# Patient Record
Sex: Female | Born: 1970 | Race: Black or African American | Hispanic: No | Smoking: Never smoker
Health system: Southern US, Community
[De-identification: ages and names within clinical notes are randomized; demographics above are authoritative.]

## PROBLEM LIST (undated history)

## (undated) DIAGNOSIS — R87810 Cervical high risk human papillomavirus (HPV) DNA test positive: Principal | ICD-10-CM

## (undated) DIAGNOSIS — R87610 Atypical squamous cells of undetermined significance on cytologic smear of cervix (ASC-US): Secondary | ICD-10-CM

## (undated) HISTORY — DX: Cervical high risk human papillomavirus (HPV) DNA test positive: R87.810

## (undated) HISTORY — PX: NO PAST SURGERIES: SHX2092

## (undated) HISTORY — PX: COLPOSCOPY: SHX161

## (undated) HISTORY — DX: Atypical squamous cells of undetermined significance on cytologic smear of cervix (ASC-US): R87.610

## (undated) HISTORY — PX: TUBAL LIGATION: SHX77

---

## 1995-10-29 DIAGNOSIS — O4290 Premature rupture of membranes, unspecified as to length of time between rupture and onset of labor, unspecified weeks of gestation: Secondary | ICD-10-CM

## 2004-03-10 ENCOUNTER — Emergency Department: Payer: Self-pay | Admitting: Emergency Medicine

## 2015-10-14 ENCOUNTER — Ambulatory Visit: Payer: Self-pay | Admitting: Family Medicine

## 2015-10-30 ENCOUNTER — Encounter: Payer: Self-pay | Admitting: Family Medicine

## 2015-10-30 ENCOUNTER — Ambulatory Visit (INDEPENDENT_AMBULATORY_CARE_PROVIDER_SITE_OTHER): Payer: BLUE CROSS/BLUE SHIELD | Admitting: Family Medicine

## 2015-10-30 VITALS — BP 110/70 | HR 90 | Temp 98.4°F | Resp 16 | Ht 64.0 in | Wt 133.6 lb

## 2015-10-30 DIAGNOSIS — R5383 Other fatigue: Secondary | ICD-10-CM | POA: Diagnosis not present

## 2015-10-30 DIAGNOSIS — N926 Irregular menstruation, unspecified: Secondary | ICD-10-CM | POA: Insufficient documentation

## 2015-10-30 DIAGNOSIS — R002 Palpitations: Secondary | ICD-10-CM | POA: Diagnosis not present

## 2015-10-30 NOTE — Progress Notes (Signed)
BP 110/70 mmHg  Pulse 90  Temp(Src) 98.4 F (36.9 C) (Oral)  Resp 16  Ht 5\' 4"  (1.626 m)  Wt 133 lb 9.6 oz (60.601 kg)  BMI 22.92 kg/m2  SpO2 98%  LMP 10/18/2015   Subjective:    Patient ID: Dana Wolf, female    DOB: 26-Sep-1970, 45 y.o.   MRN: [id number]  HPI: Dana Wolf is a 45 y.o. female  Chief Complaint  Patient presents with  . Menstrual Problem   Patient is here for evaluation of irregular periods Normal periods up through [place]; after 10 days, spotted for 2 more, the off, then bled heavy for 5 days; clots of blood; no breast tenderness, no nausea; she does not suspect pregnancy or miscarriage; her tubes are tied and husband has had a vascectomy Menarche at age 53; hx of heavy periods at times with clots; 3 SVD, largest child 7 pounds No personal hx of thyroid trouble; no fam hx of thyroid trouble No change in weight Stools have been a little loose Feeling a little shaky and anxious; she can wake up and feel her heart racing Father died last week, he had been sick Fatigued; not really resting; wakes up and can still sleep more when she gets up; not snoring Hot sweats  Depression screen PHQ 2/9 10/30/2015  Decreased Interest 0  Down, Depressed, Hopeless 0  PHQ - 2 Score 0   Relevant past medical, surgical, family and social history reviewed  History reviewed. No pertinent past medical history.  MD notes: Hx of anemia with first child; took iron pills; she has had three children; normal vaginal deliveries  Past Surgical History  Procedure Laterality Date  . No past surgeries    MD notes: 3 spontaneous vaginal deliveries  History reviewed. No pertinent family history.  MD notes: Adopted; father just died last week (not biological); he had been sick  Social History  Substance Use Topics  . Smoking status: Never Smoker   . Smokeless tobacco: None  . Alcohol Use: No   Interim medical history since last visit reviewed. Allergies and medications  reviewed  Review of Systems Per HPI unless specifically indicated above     Objective:    BP 110/70 mmHg  Pulse 90  Temp(Src) 98.4 F (36.9 C) (Oral)  Resp 16  Ht 5\' 4"  (1.626 m)  Wt 133 lb 9.6 oz (60.601 kg)  BMI 22.92 kg/m2  SpO2 98%  LMP 10/18/2015  Wt Readings from Last 3 Encounters:  10/30/15 133 lb 9.6 oz (60.601 kg)  MD note: weight was 133 pounds in march 2015  Physical Exam  Constitutional: She appears well-developed and well-nourished.  HENT:  Mouth/Throat: Mucous membranes are normal.  Eyes: EOM are normal. No scleral icterus.  No proptosis or exophthamos  Neck: No thyroid mass and no thyromegaly present.  Cardiovascular: Normal rate and regular rhythm.   Pulmonary/Chest: Effort normal and breath sounds normal.  Skin: She is not diaphoretic. No pallor.  Psychiatric: She has a normal mood and affect. Her behavior is normal.      Assessment & Plan:   Problem List Items Addressed This Visit      Other   Fatigue    Could by thyroid disorder; will start with labs; father's recent illness and death could play a part too; she does not want any medicine      Relevant Orders   Comprehensive metabolic panel   TSH   T4, free   CBC with Differential/Platelet  VITAMIN D 25 Hydroxy (Vit-D Deficiency, Fractures)   Irregular periods - Primary    ddx includes thyroid disorder; doubt perimenopause; could be endometriosis or fibroids; will start with labs, consider pelvic US and/or referral to gyn; stress with father's recent illness and death could certainly have affected her periods; no medicines prescribed today      Relevant Orders   Follicle stimulating hormone   Luteinizing hormone   TSH   T4, free    Other Visit Diagnoses    Palpitations        check K+, Mg2+    Relevant Orders    Magnesium       Follow up plan: No Follow-up on file.  An after-visit summary was printed and given to the patient at [facility].  Please see the patient instructions  which may contain other information and recommendations beyond what is mentioned above in the assessment and plan.  Orders Placed This Encounter  Procedures  . Comprehensive metabolic panel  . Follicle stimulating hormone  . Luteinizing hormone  . TSH  . T4, free  . CBC with Differential/Platelet  . VITAMIN D 25 Hydroxy (Vit-D Deficiency, Fractures)  . Magnesium

## 2015-10-30 NOTE — Patient Instructions (Signed)
Let's get labs today We'll call you tomorrow with the results Take a multiple vitamin daily Drink 8 glasses of water a day Call me with any changes

## 2015-11-02 NOTE — Assessment & Plan Note (Signed)
ddx includes thyroid disorder; doubt perimenopause; could be endometriosis or fibroids; will start with labs, consider pelvic US and/or referral to gyn; stress with father's recent illness and death could certainly have affected her periods; no medicines prescribed today

## 2015-11-02 NOTE — Assessment & Plan Note (Signed)
Could by thyroid disorder; will start with labs; father's recent illness and death could play a part too; she does not want any medicine

## 2015-11-19 ENCOUNTER — Encounter: Payer: BLUE CROSS/BLUE SHIELD | Admitting: Family Medicine

## 2015-11-29 ENCOUNTER — Encounter: Payer: Self-pay | Admitting: Family Medicine

## 2016-07-08 ENCOUNTER — Encounter: Payer: BLUE CROSS/BLUE SHIELD | Admitting: Family Medicine

## 2016-08-02 ENCOUNTER — Encounter: Payer: Self-pay | Admitting: Family Medicine

## 2016-08-02 ENCOUNTER — Other Ambulatory Visit: Payer: Self-pay | Admitting: Family Medicine

## 2016-08-02 ENCOUNTER — Ambulatory Visit (INDEPENDENT_AMBULATORY_CARE_PROVIDER_SITE_OTHER): Payer: BLUE CROSS/BLUE SHIELD | Admitting: Family Medicine

## 2016-08-02 VITALS — BP 110/72 | HR 93 | Temp 98.0°F | Resp 16 | Ht 63.9 in | Wt 138.7 lb

## 2016-08-02 DIAGNOSIS — R87610 Atypical squamous cells of undetermined significance on cytologic smear of cervix (ASC-US): Secondary | ICD-10-CM | POA: Diagnosis not present

## 2016-08-02 DIAGNOSIS — Z1231 Encounter for screening mammogram for malignant neoplasm of breast: Secondary | ICD-10-CM

## 2016-08-02 DIAGNOSIS — Z0000 Encounter for general adult medical examination without abnormal findings: Secondary | ICD-10-CM

## 2016-08-02 DIAGNOSIS — N898 Other specified noninflammatory disorders of vagina: Secondary | ICD-10-CM | POA: Diagnosis not present

## 2016-08-02 DIAGNOSIS — Z1239 Encounter for other screening for malignant neoplasm of breast: Secondary | ICD-10-CM

## 2016-08-02 DIAGNOSIS — Z114 Encounter for screening for human immunodeficiency virus [HIV]: Secondary | ICD-10-CM

## 2016-08-02 DIAGNOSIS — Z124 Encounter for screening for malignant neoplasm of cervix: Secondary | ICD-10-CM | POA: Diagnosis not present

## 2016-08-02 LAB — COMPLETE METABOLIC PANEL WITH GFR
ALBUMIN: 4.2 g/dL (ref 3.6–5.1)
ALT: 12 U/L (ref 6–29)
AST: 14 U/L (ref 10–30)
Alkaline Phosphatase: 53 U/L (ref 33–115)
BUN: 8 mg/dL (ref 7–25)
CALCIUM: 9.3 mg/dL (ref 8.6–10.2)
CHLORIDE: 105 mmol/L (ref 98–110)
CO2: 30 mmol/L (ref 20–31)
CREATININE: 0.63 mg/dL (ref 0.50–1.10)
GFR, Est Non African American: >89 mL/min (ref >=60)
Glucose, Bld: 91 mg/dL (ref 65–99)
Potassium: 4.3 mmol/L (ref 3.5–5.3)
Sodium: 139 mmol/L (ref 135–146)
TOTAL PROTEIN: 6.6 g/dL (ref 6.1–8.1)
Total Bilirubin: 0.8 mg/dL (ref 0.2–1.2)

## 2016-08-02 LAB — CBC WITH DIFFERENTIAL/PLATELET
Basophils Absolute: 0 cells/uL (ref 0–200)
Basophils Relative: 0 %
EOS ABS: 69 {cells}/uL (ref 15–500)
Eosinophils Relative: 1 %
HEMATOCRIT: 40.8 % (ref 35.0–45.0)
HEMOGLOBIN: 13.6 g/dL (ref 11.7–15.5)
LYMPHS ABS: 1656 {cells}/uL (ref 850–3900)
Lymphocytes Relative: 24 %
MCH: 31.5 pg (ref 27.0–33.0)
MCHC: 33.3 g/dL (ref 32.0–36.0)
MCV: 94.4 fL (ref 80.0–100.0)
MONO ABS: 483 {cells}/uL (ref 200–950)
MPV: 10.5 fL (ref 7.5–12.5)
Monocytes Relative: 7 %
NEUTROS PCT: 68 %
Neutro Abs: 4692 cells/uL (ref 1500–7800)
Platelets: 295 10*3/uL (ref 140–400)
RBC: 4.32 MIL/uL (ref 3.80–5.10)
RDW: 11.9 % (ref 11.0–15.0)
WBC: 6.9 10*3/uL (ref 3.8–10.8)

## 2016-08-02 LAB — LIPID PANEL
CHOLESTEROL: 146 mg/dL (ref <200)
HDL: 48 mg/dL — AB (ref >50)
LDL CALC: 82 mg/dL (ref <100)
TRIGLYCERIDES: 79 mg/dL (ref <150)
Total CHOL/HDL Ratio: 3 Ratio (ref <5.0)
VLDL: 16 mg/dL (ref <30)

## 2016-08-02 LAB — HIV ANTIBODY (ROUTINE TESTING W REFLEX): HIV 1&2 Ab, 4th Generation: NONREACTIVE

## 2016-08-02 NOTE — Assessment & Plan Note (Signed)
Pap smear done today

## 2016-08-02 NOTE — Progress Notes (Signed)
Patient ID: Dana Wolf, female   DOB: 23-Jan-1971, 46 y.o.   MRN: [id number]   Subjective:   Dana Wolf is a 46 y.o. female here for a complete physical exam  Interim issues since last visit: none  USPSTF grade A and B recommendations Alcohol: occasional Depression:  Depression screen [facility] 2/9 08/02/2016 10/30/2015  Decreased Interest 0 0  Down, Depressed, Hopeless 0 0  PHQ - 2 Score 0 0   Hypertension: controlled Obesity: no Tobacco use: passive smoke exposure HIV, hep B, hep C: Hiv already tested STD testing and prevention (chl/gon/syphilis): accepted; some discharge Lipids:  Glucose:  Colorectal cancer:  Breast cancer: no lumps; never had a mammogram BRCA gene screening: adopted, so fam hx unknown Intimate partner violence: no abuse Cervical cancer screening: today Lung cancer: n/a Osteoporosis: n/a Fall prevention/vitamin D: vit D 1000 iu recommended AAA: n/a Aspirin: n/a Diet: not enough fruits and veggies Exercise: need to improve Skin cancer: no worrisome moles Vaccines: flu UTD; not sure about tetanus, cannot get into old system  No past medical history on file.   Past Surgical History:  Procedure Laterality Date  . NO PAST SURGERIES     Family History  Problem Relation Age of Onset  . Adopted: Yes   Social History  Substance Use Topics  . Smoking status: Never Smoker  . Smokeless tobacco: Never Used  . Alcohol use No   Review of Systems  Objective:   Vitals:   08/02/16 0823  BP: 110/72  Pulse: 93  Resp: 16  Temp: 98 F (36.7 C)  TempSrc: Oral  SpO2: 98%  Weight: 138 lb 11.2 oz (62.9 kg)  Height: 5' 3.9" (1.623 m)   Body mass index is 23.88 kg/m. Wt Readings from Last 3 Encounters:  08/02/16 138 lb 11.2 oz (62.9 kg)  10/30/15 133 lb 9.6 oz (60.6 kg)   Physical Exam  Constitutional: She appears well-developed and well-nourished.  HENT:  Head: Normocephalic and atraumatic.  Eyes: Conjunctivae and EOM are normal. Right eye  exhibits no hordeolum. Left eye exhibits no hordeolum. No scleral icterus.  Neck: Carotid bruit is not present. No thyromegaly present.  Cardiovascular: Normal rate, regular rhythm, S1 normal, S2 normal and normal heart sounds.   No extrasystoles are present.  Pulmonary/Chest: Effort normal and breath sounds normal. No respiratory distress. Right breast exhibits no inverted nipple, no mass, no nipple discharge, no skin change and no tenderness. Left breast exhibits no inverted nipple, no mass, no nipple discharge, no skin change and no tenderness. Breasts are symmetrical.  Abdominal: Soft. Normal appearance and bowel sounds are normal. She exhibits no distension, no abdominal bruit, no pulsatile midline mass and no mass. There is no hepatosplenomegaly. There is no tenderness. No hernia.  Genitourinary: Uterus normal. Mass: scant area of friability 1 o'clock cervix. Pelvic exam was performed with patient prone. There is no rash or lesion on the right labia. There is no rash or lesion on the left labia. Cervix exhibits no motion tenderness and no discharge. Right adnexum displays no mass, no tenderness and no fullness. Left adnexum displays no mass, no tenderness and no fullness. No erythema or bleeding in the vagina. Injury: some clumpy whitish material, as well as thin watery discharge with fishy odor.    Musculoskeletal: Normal range of motion. She exhibits no edema.  Lymphadenopathy:       Head (right side): No submandibular adenopathy present.       Head (left side): No submandibular adenopathy present.  She has no cervical adenopathy.    She has no axillary adenopathy.  Neurological: She is alert. She displays no tremor. No cranial nerve deficit. She exhibits normal muscle tone. Gait normal.  Skin: Skin is warm and dry. No bruising and no ecchymosis noted. No cyanosis. No pallor.  Psychiatric: Her speech is normal and behavior is normal. Thought content normal. Her mood appears not anxious. She  does not exhibit a depressed mood.    Assessment/Plan:   Problem List Items Addressed This Visit      Other   Preventative health care - Primary    USPSTF grade A and B recommendations reviewed with patient; age-appropriate recommendations, preventive care, screening tests, etc discussed and encouraged; healthy living encouraged; see AVS for patient education given to patient      Relevant Orders   COMPLETE METABOLIC PANEL WITH GFR   Lipid panel   CBC with Differential/Platelet   Cervical cancer screening    Pap smear done today      Relevant Orders   Pap IG and HPV (high risk) DNA detection    Other Visit Diagnoses    Screening for HIV (human immunodeficiency virus)       Relevant Orders   HIV antibody (with reflex)   Vaginal discharge       Relevant Orders   WET PREP BY MOLECULAR PROBE   Breast cancer screening       Relevant Orders   MM DIGITAL SCREENING BILATERAL       No orders of the defined types were placed in this encounter.  Orders Placed This Encounter  Procedures  . WET PREP BY MOLECULAR PROBE  . MM DIGITAL SCREENING BILATERAL    Order Specific Question:   Reason for Exam (SYMPTOM  OR DIAGNOSIS REQUIRED)    Answer:   screening    Order Specific Question:   Is the patient pregnant?    Answer:   No    Order Specific Question:   Preferred imaging location?    Answer:   [facility]  . HIV antibody (with reflex)  . COMPLETE METABOLIC PANEL WITH GFR  . Lipid panel  . CBC with Differential/Platelet    Follow up plan: Return in about 1 year (around 08/02/2017) for complete physical.  An After Visit Summary was printed and given to the patient.

## 2016-08-02 NOTE — Assessment & Plan Note (Signed)
USPSTF grade A and B recommendations reviewed with patient; age-appropriate recommendations, preventive care, screening tests, etc discussed and encouraged; healthy living encouraged; see AVS for patient education given to patient  

## 2016-08-02 NOTE — Patient Instructions (Addendum)
I'll suggest 1,000 iu of vitamin D3 once a day Please do call to schedule your mammogram; the number to schedule one at either [facility] or [facility] is [phone]  Health Maintenance, Female Introduction Adopting a healthy lifestyle and getting preventive care can go a long way to promote health and wellness. Talk with your health care provider about what schedule of regular examinations is right for you. This is a good chance for you to check in with your provider about disease prevention and staying healthy. In between checkups, there are plenty of things you can do on your own. Experts have done a lot of research about which lifestyle changes and preventive measures are most likely to keep you healthy. Ask your health care provider for more information. Weight and diet Eat a healthy diet  Be sure to include plenty of vegetables, fruits, low-fat dairy products, and lean protein.  Do not eat a lot of foods high in solid fats, added sugars, or salt.  Get regular exercise. This is one of the most important things you can do for your health.  Most adults should exercise for at least 150 minutes each week. The exercise should increase your heart rate and make you sweat (moderate-intensity exercise).  Most adults should also do strengthening exercises at least twice a week. This is in addition to the moderate-intensity exercise. Maintain a healthy weight  Body mass index (BMI) is a measurement that can be used to identify possible weight problems. It estimates body fat based on height and weight. Your health care provider can help determine your BMI and help you achieve or maintain a healthy weight.  For females 3 years of age and older:  A BMI below 18.5 is considered underweight.  A BMI of 18.5 to 24.9 is normal.  A BMI of 25 to 29.9 is considered overweight.  A BMI of 30 and above is considered obese. Watch levels of cholesterol and blood  lipids  You should start having your blood tested for lipids and cholesterol at 44 years of age, then have this test every 5 years.  You may need to have your cholesterol levels checked more often if:  Your lipid or cholesterol levels are high.  You are older than 44 years of age.  You are at high risk for heart disease. Cancer screening Lung Cancer  Lung cancer screening is recommended for adults 74-56 years old who are at high risk for lung cancer because of a history of smoking.  A yearly low-dose CT scan of the lungs is recommended for people who:  Currently smoke.  Have quit within the past 15 years.  Have at least a 30-pack-year history of smoking. A pack year is smoking an average of one pack of cigarettes a day for 1 year.  Yearly screening should continue until it has been 15 years since you quit.  Yearly screening should stop if you develop a health problem that would prevent you from having lung cancer treatment. Breast Cancer  Practice breast self-awareness. This means understanding how your breasts normally appear and feel.  It also means doing regular breast self-exams. Let your health care provider know about any changes, no matter how small.  If you are in your 20s or 30s, you should have a clinical breast exam (CBE) by a health care provider every 1-3 years as part of a regular health exam.  If you are 75 or older, have a CBE every year. Also consider having  a breast X-ray (mammogram) every year.  If you have a family history of breast cancer, talk to your health care provider about genetic screening.  If you are at high risk for breast cancer, talk to your health care provider about having an MRI and a mammogram every year.  Breast cancer gene (BRCA) assessment is recommended for women who have family members with BRCA-related cancers. BRCA-related cancers include:  Breast.  Ovarian.  Tubal.  Peritoneal cancers.  Results of the assessment will  determine the need for genetic counseling and BRCA1 and BRCA2 testing. Cervical Cancer  Your health care provider may recommend that you be screened regularly for cancer of the pelvic organs (ovaries, uterus, and vagina). This screening involves a pelvic examination, including checking for microscopic changes to the surface of your cervix (Pap test). You may be encouraged to have this screening done every 3 years, beginning at age 82.  For women ages 58-65, health care providers may recommend pelvic exams and Pap testing every 3 years, or they may recommend the Pap and pelvic exam, combined with testing for human papilloma virus (HPV), every 5 years. Some types of HPV increase your risk of cervical cancer. Testing for HPV may also be done on women of any age with unclear Pap test results.  Other health care providers may not recommend any screening for nonpregnant women who are considered low risk for pelvic cancer and who do not have symptoms. Ask your health care provider if a screening pelvic exam is right for you.  If you have had past treatment for cervical cancer or a condition that could lead to cancer, you need Pap tests and screening for cancer for at least 20 years after your treatment. If Pap tests have been discontinued, your risk factors (such as having a new sexual partner) need to be reassessed to determine if screening should resume. Some women have medical problems that increase the chance of getting cervical cancer. In these cases, your health care provider may recommend more frequent screening and Pap tests. Colorectal Cancer  This type of cancer can be detected and often prevented.  Routine colorectal cancer screening usually begins at 44 years of age and continues through 44 years of age.  Your health care provider may recommend screening at an earlier age if you have risk factors for colon cancer.  Your health care provider may also recommend using home test kits to check for  hidden blood in the stool.  A small camera at the end of a tube can be used to examine your colon directly (sigmoidoscopy or colonoscopy). This is done to check for the earliest forms of colorectal cancer.  Routine screening usually begins at age 9.  Direct examination of the colon should be repeated every 5-10 years through 44 years of age. However, you may need to be screened more often if early forms of precancerous polyps or small growths are found. Skin Cancer  Check your skin from head to toe regularly.  Tell your health care provider about any new moles or changes in moles, especially if there is a change in a mole's shape or color.  Also tell your health care provider if you have a mole that is larger than the size of a pencil eraser.  Always use sunscreen. Apply sunscreen liberally and repeatedly throughout the day.  Protect yourself by wearing long sleeves, pants, a wide-brimmed hat, and sunglasses whenever you are outside. Heart disease, diabetes, and high blood pressure  High blood pressure  causes heart disease and increases the risk of stroke. High blood pressure is more likely to develop in:  People who have blood pressure in the high end of the normal range (130-139/85-89 mm Hg).  People who are overweight or obese.  People who are African American.  If you are 72-25 years of age, have your blood pressure checked every 3-5 years. If you are 54 years of age or older, have your blood pressure checked every year. You should have your blood pressure measured twice-once when you are at a hospital or clinic, and once when you are not at a hospital or clinic. Record the average of the two measurements. To check your blood pressure when you are not at a hospital or clinic, you can use:  An automated blood pressure machine at a pharmacy.  A home blood pressure monitor.  If you are between 50 years and 41 years old, ask your health care provider if you should take aspirin to  prevent strokes.  Have regular diabetes screenings. This involves taking a blood sample to check your fasting blood sugar level.  If you are at a normal weight and have a low risk for diabetes, have this test once every three years after 44 years of age.  If you are overweight and have a high risk for diabetes, consider being tested at a younger age or more often. Preventing infection Hepatitis B  If you have a higher risk for hepatitis B, you should be screened for this virus. You are considered at high risk for hepatitis B if:  You were born in a country where hepatitis B is common. Ask your health care provider which countries are considered high risk.  Your parents were born in a high-risk country, and you have not been immunized against hepatitis B (hepatitis B vaccine).  You have HIV or AIDS.  You use needles to inject street drugs.  You live with someone who has hepatitis B.  You have had sex with someone who has hepatitis B.  You get hemodialysis treatment.  You take certain medicines for conditions, including cancer, organ transplantation, and autoimmune conditions. Hepatitis C  Blood testing is recommended for:  Everyone born from [birth year] through 1963.  Anyone with known risk factors for hepatitis C. Sexually transmitted infections (STIs)  You should be screened for sexually transmitted infections (STIs) including gonorrhea and chlamydia if:  You are sexually active and are younger than 44 years of age.  You are older than 44 years of age and your health care provider tells you that you are at risk for this type of infection.  Your sexual activity has changed since you were last screened and you are at an increased risk for chlamydia or gonorrhea. Ask your health care provider if you are at risk.  If you do not have HIV, but are at risk, it may be recommended that you take a prescription medicine daily to prevent HIV infection. This is called pre-exposure  prophylaxis (PrEP). You are considered at risk if:  You are sexually active and do not regularly use condoms or know the HIV status of your partner(s).  You take drugs by injection.  You are sexually active with a partner who has HIV. Talk with your health care provider about whether you are at high risk of being infected with HIV. If you choose to begin PrEP, you should first be tested for HIV. You should then be tested every 3 months for as long as you are  taking PrEP. Pregnancy  If you are premenopausal and you may become pregnant, ask your health care provider about preconception counseling.  If you may become pregnant, take 400 to 800 micrograms (mcg) of folic acid every day.  If you want to prevent pregnancy, talk to your health care provider about birth control (contraception). Osteoporosis and menopause  Osteoporosis is a disease in which the bones lose minerals and strength with aging. This can result in serious bone fractures. Your risk for osteoporosis can be identified using a bone density scan.  If you are 63 years of age or older, or if you are at risk for osteoporosis and fractures, ask your health care provider if you should be screened.  Ask your health care provider whether you should take a calcium or vitamin D supplement to lower your risk for osteoporosis.  Menopause may have certain physical symptoms and risks.  Hormone replacement therapy may reduce some of these symptoms and risks. Talk to your health care provider about whether hormone replacement therapy is right for you. Follow these instructions at home:  Schedule regular health, dental, and eye exams.  Stay current with your immunizations.  Do not use any tobacco products including cigarettes, chewing tobacco, or electronic cigarettes.  If you are pregnant, do not drink alcohol.  If you are breastfeeding, limit how much and how often you drink alcohol.  Limit alcohol intake to no more than 1 drink  per day for nonpregnant women. One drink equals 12 ounces of beer, 5 ounces of wine, or 1 ounces of hard liquor.  Do not use street drugs.  Do not share needles.  Ask your health care provider for help if you need support or information about quitting drugs.  Tell your health care provider if you often feel depressed.  Tell your health care provider if you have ever been abused or do not feel safe at home. This information is not intended to replace advice given to you by your health care provider. Make sure you discuss any questions you have with your health care provider. Document Released: 12/07/2010 Document Revised: 10/30/2015 Document Reviewed: 02/25/2015  2017 [employer]

## 2016-08-03 LAB — WET PREP BY MOLECULAR PROBE
CANDIDA SPECIES: NOT DETECTED
GARDNERELLA VAGINALIS: DETECTED — AB
TRICHOMONAS VAG: NOT DETECTED

## 2016-08-04 ENCOUNTER — Telehealth: Payer: Self-pay | Admitting: Family Medicine

## 2016-08-04 ENCOUNTER — Encounter: Payer: Self-pay | Admitting: Family Medicine

## 2016-08-04 DIAGNOSIS — R87610 Atypical squamous cells of undetermined significance on cytologic smear of cervix (ASC-US): Secondary | ICD-10-CM

## 2016-08-04 DIAGNOSIS — R87810 Cervical high risk human papillomavirus (HPV) DNA test positive: Principal | ICD-10-CM

## 2016-08-04 HISTORY — DX: Atypical squamous cells of undetermined significance on cytologic smear of cervix (ASC-US): R87.610

## 2016-08-04 LAB — PAP IG AND HPV HIGH-RISK: HPV DNA HIGH RISK: DETECTED — AB

## 2016-08-04 MED ORDER — METRONIDAZOLE 500 MG PO TABS: 500.0000 mg | ORAL_TABLET | Freq: Two times a day (BID) | ORAL | 0 refills | Status: AC

## 2016-08-04 NOTE — Assessment & Plan Note (Signed)
Refer to GYN; discussed with pt by phone; sexually transmitted; condoms

## 2016-08-04 NOTE — Telephone Encounter (Signed)
I discussed pap smear with patient; referral placed to gyn [facility] [place]; start flagyl Other labs discussed

## 2016-08-11 ENCOUNTER — Telehealth: Payer: Self-pay | Admitting: Family Medicine

## 2016-08-11 NOTE — Telephone Encounter (Signed)
errenous °

## 2016-08-24 ENCOUNTER — Encounter: Payer: BLUE CROSS/BLUE SHIELD | Admitting: Obstetrics and Gynecology

## 2016-09-02 ENCOUNTER — Encounter: Payer: Self-pay | Admitting: Obstetrics and Gynecology

## 2016-09-02 ENCOUNTER — Ambulatory Visit (INDEPENDENT_AMBULATORY_CARE_PROVIDER_SITE_OTHER): Payer: BLUE CROSS/BLUE SHIELD | Admitting: Obstetrics and Gynecology

## 2016-09-02 VITALS — BP 108/74 | HR 79 | Ht 63.0 in | Wt 143.0 lb

## 2016-09-02 DIAGNOSIS — R87810 Cervical high risk human papillomavirus (HPV) DNA test positive: Secondary | ICD-10-CM

## 2016-09-02 DIAGNOSIS — N72 Inflammatory disease of cervix uteri: Secondary | ICD-10-CM | POA: Diagnosis not present

## 2016-09-02 DIAGNOSIS — R87610 Atypical squamous cells of undetermined significance on cytologic smear of cervix (ASC-US): Secondary | ICD-10-CM | POA: Diagnosis not present

## 2016-09-02 NOTE — Addendum Note (Signed)
Addended by: Raliegh Ip on: 09/02/2016 03:05 PM   Modules accepted: Orders

## 2016-09-02 NOTE — Progress Notes (Signed)
Referring Provider:  Sanda Klein  HPI:  Dana Wolf is a 46 y.o.  [phone]  who presents today for evaluation and management of abnormal cervical cytology.    Dysplasia History:  ASCUS witn POS High risk viral types  ROS:  Pertinent items are noted in HPI.  OB History  Gravida Para Term Preterm AB Living  3 3 2 1   3   SAB TAB Ectopic Multiple Live Births          3    # Outcome Date GA Lbr Len/2nd Weight Sex Delivery Anes PTL Lv  3 Term 1999    F Vag-Spont   LIV  2 Preterm 1997    M Vag-Spont   LIV  1 Term 96    F Vag-Spont   LIV      Past Medical History:  Diagnosis Date  . 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 NO PAST SURGERIES      SOCIAL HISTORY: History  Alcohol Use No   History  Drug Use No     Family History  Problem Relation Age of Onset  . Adopted: Yes    ALLERGIES:  Patient has no known allergies.  She currently has no medications in their medication list.  Physical Exam: -Vitals:  BP 108/74   Pulse 79   Ht 5\' 3"  (1.6 m)   Wt 143 lb (64.9 kg)   LMP 08/20/2016 (Exact Date)   BMI 25.33 kg/m  GEN: WD, WN, NAD.  A+ O x 3, good mood and affect. ABD:  NT, ND.  Soft, no masses.  No hernias noted.   Pelvic:   Vulva: Normal appearance.  No lesions.  Vagina: No lesions or abnormalities noted.  Support: Normal pelvic support.  Urethra No masses tenderness or scarring.  Meatus Normal size without lesions or prolapse.  Cervix: See below.  Anus: Normal exam.  No lesions.  Perineum: Normal exam.  No lesions.        Bimanual   Uterus: Normal size.  Non-tender.  Mobile.  AV.  Adnexae: No masses.  Non-tender to palpation.  Cul-de-sac: Negative for abnormality.   PROCEDURE: 1.  Urine Pregnancy Test:  not done 2.  Colposcopy performed with 4% acetic acid after verbal consent obtained                           -Aceto-white Lesions Location(s): 8-11 and 2-4  o'clock.              -Biopsy performed at 8 and 4 o'clock               -ECC indicated and performed: No.     -Biopsy sites made hemostatic with pressure and Monsel's solution   -Satisfactory colposcopy: Yes.      -Evidence of Invasive cervical CA :  NO  ASSESSMENT:  Dana Wolf is a 46 y.o. [id number] here for  1. ASCUS with positive high risk HPV cervical   .  PLAN: 1.  I discussed the grading system of pap smears and HPV high risk viral types.  We will discuss management after colpo results return.  No orders of the defined types were placed in this encounter.         F/U  Return in about 1 week (around 09/09/2016).  [name] ,MD 09/02/2016,2:20 PM

## 2016-09-06 LAB — PATHOLOGY

## 2016-09-09 ENCOUNTER — Encounter: Payer: Self-pay | Admitting: Obstetrics and Gynecology

## 2016-09-09 ENCOUNTER — Ambulatory Visit (INDEPENDENT_AMBULATORY_CARE_PROVIDER_SITE_OTHER): Payer: BLUE CROSS/BLUE SHIELD | Admitting: Obstetrics and Gynecology

## 2016-09-09 VITALS — BP 95/65 | HR 82 | Ht 63.0 in | Wt 142.4 lb

## 2016-09-09 DIAGNOSIS — R87610 Atypical squamous cells of undetermined significance on cytologic smear of cervix (ASC-US): Secondary | ICD-10-CM

## 2016-09-09 DIAGNOSIS — R87810 Cervical high risk human papillomavirus (HPV) DNA test positive: Secondary | ICD-10-CM | POA: Diagnosis not present

## 2016-09-09 NOTE — Progress Notes (Addendum)
HPI:      [name] is a 46 y.o. [phone] who LMP was Patient's last menstrual period was 08/20/2016 (exact date).  Subjective:   She presents today For follow-up after colposcopy. Her colposcopy revealed no dysplasia. This is consistent with ASCUS Pap smear. (She does have positive HPV.)    Hx: The following portions of the patient's history were reviewed and updated as appropriate:              She  has a past medical history of ASCUS with positive high risk HPV cervical (08/04/2016). She  does not have any pertinent problems on file. She  has a past surgical history that includes No past surgeries and Colposcopy. No current outpatient prescriptions on file prior to visit.   No current facility-administered medications on file prior to visit.          Review of Systems:  Review of Systems  Constitutional: Denied constitutional symptoms, night sweats, recent illness, fatigue, fever, insomnia and weight loss.  Eyes: Denied eye symptoms, eye pain, photophobia, vision change and visual disturbance.  Ears/Nose/Throat/Neck: Denied ear, nose, throat or neck symptoms, hearing loss, nasal discharge, sinus congestion and sore throat.  Cardiovascular: Denied cardiovascular symptoms, arrhythmia, chest pain/pressure, edema, exercise intolerance, orthopnea and palpitations.  Respiratory: Denied pulmonary symptoms, asthma, pleuritic pain, productive sputum, cough, dyspnea and wheezing.  Gastrointestinal: Denied, gastro-esophageal reflux, melena, nausea and vomiting.  Genitourinary: Denied genitourinary symptoms including symptomatic vaginal discharge, pelvic relaxation issues, and urinary complaints.  Musculoskeletal: Denied musculoskeletal symptoms, stiffness, swelling, muscle weakness and myalgia.  Dermatologic: Denied dermatology symptoms, rash and scar.  Neurologic: Denied neurology symptoms, dizziness, headache, neck pain and syncope.  Psychiatric: Denied psychiatric symptoms, anxiety  and depression.  Endocrine: Denied endocrine symptoms including hot flashes and night sweats.   Meds:   No current outpatient prescriptions on file prior to visit.   No current facility-administered medications on file prior to visit.     Objective:     Vitals:   09/09/16 1527  BP: 95/65  Pulse: 82              Cervical biopsy results reviewed and discussed directly with the patient.  Assessment:    [id number] Patient Active Problem List   Diagnosis Date Noted  . ASCUS with positive high risk HPV cervical 08/04/2016  . Preventative health care 08/02/2016  . Cervical cancer screening 08/02/2016  . Irregular periods 10/30/2015  . Fatigue 10/30/2015     1. ASCUS with positive high risk HPV cervical     No evidence of abnormality on colposcopically directed biopsies.   Plan:            1.  Recommend patient return to Dr. Sanda Klein for routine Pap cytology only in February. If worse than ASCUS re-colpo.  If ASCUS consider repeat cytology 6 months later.        F/U  No Follow-up on file. I spent 16 minutes with this patient of which greater than 50% was spent discussing abnormal cervical cytology, colposcopy results, future follow-up.  Finis Bud, M.D. 09/09/2016 3:46 PM

## 2016-09-14 ENCOUNTER — Ambulatory Visit
Admission: RE | Admit: 2016-09-14 | Discharge: 2016-09-14 | Disposition: A | Discharge status: Home / Self Care | Payer: BLUE CROSS/BLUE SHIELD | Source: Ambulatory Visit | Attending: Family Medicine | Admitting: Family Medicine

## 2016-09-14 DIAGNOSIS — Z1231 Encounter for screening mammogram for malignant neoplasm of breast: Secondary | ICD-10-CM | POA: Insufficient documentation

## 2016-09-15 ENCOUNTER — Other Ambulatory Visit: Payer: Self-pay

## 2016-09-15 DIAGNOSIS — N6320 Unspecified lump in the left breast, unspecified quadrant: Secondary | ICD-10-CM

## 2016-09-15 DIAGNOSIS — N6489 Other specified disorders of breast: Secondary | ICD-10-CM

## 2016-09-16 ENCOUNTER — Other Ambulatory Visit: Payer: Self-pay | Admitting: Family Medicine

## 2016-09-16 DIAGNOSIS — R928 Other abnormal and inconclusive findings on diagnostic imaging of breast: Secondary | ICD-10-CM

## 2016-09-16 DIAGNOSIS — N6489 Other specified disorders of breast: Secondary | ICD-10-CM

## 2016-09-16 DIAGNOSIS — N6320 Unspecified lump in the left breast, unspecified quadrant: Secondary | ICD-10-CM

## 2016-09-20 ENCOUNTER — Ambulatory Visit
Admission: RE | Admit: 2016-09-20 | Discharge: 2016-09-20 | Disposition: A | Discharge status: Home / Self Care | Payer: BLUE CROSS/BLUE SHIELD | Source: Ambulatory Visit | Attending: Family Medicine | Admitting: Family Medicine

## 2016-09-20 DIAGNOSIS — R928 Other abnormal and inconclusive findings on diagnostic imaging of breast: Secondary | ICD-10-CM | POA: Diagnosis not present

## 2016-09-20 DIAGNOSIS — N6320 Unspecified lump in the left breast, unspecified quadrant: Secondary | ICD-10-CM

## 2016-09-20 DIAGNOSIS — N6324 Unspecified lump in the left breast, lower inner quadrant: Secondary | ICD-10-CM | POA: Insufficient documentation

## 2016-09-20 DIAGNOSIS — N6489 Other specified disorders of breast: Secondary | ICD-10-CM

## 2017-03-10 ENCOUNTER — Telehealth: Payer: Self-pay | Admitting: Family Medicine

## 2017-03-10 DIAGNOSIS — N6459 Other signs and symptoms in breast: Secondary | ICD-10-CM | POA: Insufficient documentation

## 2017-03-10 NOTE — Telephone Encounter (Signed)
-----   Message from Arnetha Courser, MD sent at 09/20/2016  3:54 PM EDT ----- Regarding: 6 month f/u LEFT breast US RECOMMENDATION: Six-month follow-up left breast ultrasound is recommended. Due around March 22, 2017

## 2017-03-10 NOTE — Telephone Encounter (Signed)
Please let pt know that it's almost time to rescan her left breast Ask if any changes to her breasts (new lumps or problems) I've entered the left breast ultrasound already, but let me know if other issues to order other side if needed

## 2017-03-10 NOTE — Assessment & Plan Note (Signed)
Trayce 16, 2018: RECOMMENDATION: Six-month follow-up left breast ultrasound is recommended.

## 2017-03-14 NOTE — Telephone Encounter (Signed)
Pt.notified

## 2017-07-18 ENCOUNTER — Encounter: Payer: Self-pay | Admitting: Family Medicine

## 2017-07-18 ENCOUNTER — Ambulatory Visit (INDEPENDENT_AMBULATORY_CARE_PROVIDER_SITE_OTHER): Payer: BLUE CROSS/BLUE SHIELD | Admitting: Family Medicine

## 2017-07-18 ENCOUNTER — Ambulatory Visit
Admission: RE | Admit: 2017-07-18 | Discharge: 2017-07-18 | Disposition: A | Discharge status: Home / Self Care | Payer: BLUE CROSS/BLUE SHIELD | Source: Ambulatory Visit | Attending: Family Medicine | Admitting: Family Medicine

## 2017-07-18 VITALS — BP 112/72 | HR 79 | Temp 98.2°F | Resp 14 | Wt 147.8 lb

## 2017-07-18 DIAGNOSIS — M79672 Pain in left foot: Secondary | ICD-10-CM | POA: Diagnosis not present

## 2017-07-18 DIAGNOSIS — R87610 Atypical squamous cells of undetermined significance on cytologic smear of cervix (ASC-US): Secondary | ICD-10-CM

## 2017-07-18 DIAGNOSIS — G8929 Other chronic pain: Secondary | ICD-10-CM

## 2017-07-18 DIAGNOSIS — R87810 Cervical high risk human papillomavirus (HPV) DNA test positive: Secondary | ICD-10-CM | POA: Diagnosis not present

## 2017-07-18 DIAGNOSIS — N920 Excessive and frequent menstruation with regular cycle: Secondary | ICD-10-CM

## 2017-07-18 MED ORDER — NAPROXEN 375 MG PO TABS: 375.0000 mg | ORAL_TABLET | Freq: Two times a day (BID) | ORAL | 0 refills | Status: DC

## 2017-07-18 NOTE — Assessment & Plan Note (Signed)
Will start naproxen, try to block PGE-2 prior to release

## 2017-07-18 NOTE — Patient Instructions (Signed)
Please do start the new medicine which should help your periods and your foot pain Do not take any other NSAIDs (Aleve, Motrin, Advil, etc.) Tylenol per package directions is fine Have the xrays done across the street If you have not heard anything from my staff in a week about any orders/referrals/studies from today, please contact us here to follow-up [phone] Good supportive hard sole shoes

## 2017-07-18 NOTE — Progress Notes (Signed)
BP 112/72   Pulse 79   Temp 98.2 F (36.8 C) (Oral)   Resp 14   Wt 147 lb 12.8 oz (67 kg)   LMP 07/08/2017   SpO2 98%   BMI 26.18 kg/m    Subjective:    Patient ID: Dana Wolf, female    DOB: 1970/07/06, 47 y.o.   MRN: [id number]  HPI: Dana Wolf is a 47 y.o. female  Chief Complaint  Patient presents with  . Foot Pain    left comes and goes, sometimes cannot even walk on it.  . Menorrhagia    tubes are tied, she has very painful sick periods with bad crampin, nauesa and vomiting the first 2 days    HPI Patient is here for two issues One is left foot pain; comes and goes Going on for a month Some days she cannot even walk; no injuries, no falls, no twisted ankle Just started aching out of the blue and stayed there No excessive movement or hiking She has tried ibuprofen and that hasn't helped; soaked it; "I thought it would have went away on its own" she says Walks on concrete floors at work; wearing good supportive tennis shoes; no flip flops Not sure if arthritis Nothing similar before  She also has painful sick periods with bad cramps; nausea and vomiting the first two days; her periods are heavy for the first two days She is due for repeat pap only this month and is coming back for physical later this month  Depression screen [facility] 2/9 07/18/2017 08/02/2016 10/30/2015  Decreased Interest 0 0 0  Down, Depressed, Hopeless 0 0 0  PHQ - 2 Score 0 0 0    Relevant past medical, surgical, family and social history reviewed Past Medical History:  Diagnosis Date  . ASCUS with positive high risk HPV cervical 08/04/2016   Refer to GYN Feb 2018   Past Surgical History:  Procedure Laterality Date  . COLPOSCOPY    . NO PAST SURGERIES    . TUBAL LIGATION     Family History  Adopted: Yes   Social History   Tobacco Use  . Smoking status: Never Smoker  . Smokeless tobacco: Never Used  Substance Use Topics  . Alcohol use: No    Alcohol/week: 0.0 oz  . Drug use:  No    Interim medical history since last visit reviewed. Allergies and medications reviewed  Review of Systems Per HPI unless specifically indicated above     Objective:    BP 112/72   Pulse 79   Temp 98.2 F (36.8 C) (Oral)   Resp 14   Wt 147 lb 12.8 oz (67 kg)   LMP 07/08/2017   SpO2 98%   BMI 26.18 kg/m   Wt Readings from Last 3 Encounters:  07/18/17 147 lb 12.8 oz (67 kg)  09/09/16 142 lb 7 oz (64.6 kg)  09/02/16 143 lb (64.9 kg)    Physical Exam  Constitutional: She appears well-developed and well-nourished.  HENT:  Mouth/Throat: Mucous membranes are normal.  Eyes: EOM are normal. No scleral icterus.  Cardiovascular: Normal rate and regular rhythm.  Pulses:      Dorsalis pedis pulses are 2+ on the right side, and 2+ on the left side.  Pulmonary/Chest: Effort normal and breath sounds normal.  Musculoskeletal:       Right ankle: She exhibits normal range of motion and no swelling.       Left ankle: She exhibits normal range of  motion and no swelling.       Right foot: There is normal range of motion, no tenderness, no swelling and no deformity.       Left foot: There is tenderness. There is normal range of motion, no bony tenderness, no swelling and no deformity.  Skin:  No erythema, no rash over the feet; specifically nothing that is suggestive of shingles or gout on the left foot  Psychiatric: She has a normal mood and affect. Her behavior is normal. Her mood appears not anxious. She does not exhibit a depressed mood.      Assessment & Plan:   Problem List Items Addressed This Visit      Other   Heavy periods    Will start naproxen, try to block PGE-2 prior to release      ASCUS with positive high risk HPV cervical    Pap smear later this month       Other Visit Diagnoses    Chronic foot pain, left    -  Primary   ddx discussed; doubt this is gout or plantar fasciitis or heel spurs; morton's neuroma, arthritis, tendonitis more likely; will get xrays;  start Rx NSAID   Relevant Medications   naproxen (NAPROSYN) 375 MG tablet   Other Relevant Orders   DG Foot Complete Left       Follow up plan: No Follow-up on file.  An after-visit summary was printed and given to the patient at [facility].  Please see the patient instructions which may contain other information and recommendations beyond what is mentioned above in the assessment and plan.  Meds ordered this encounter  Medications  . naproxen (NAPROSYN) 375 MG tablet    Sig: Take 1 tablet (375 mg total) by mouth 2 (two) times daily with a meal. For inflammation and painful periods    Dispense:  60 tablet    Refill:  0    Orders Placed This Encounter  Procedures  . DG Foot Complete Left

## 2017-07-18 NOTE — Assessment & Plan Note (Signed)
Pap smear later this month

## 2017-08-04 ENCOUNTER — Ambulatory Visit (INDEPENDENT_AMBULATORY_CARE_PROVIDER_SITE_OTHER): Payer: BLUE CROSS/BLUE SHIELD | Admitting: Family Medicine

## 2017-08-04 ENCOUNTER — Encounter: Payer: Self-pay | Admitting: Family Medicine

## 2017-08-04 VITALS — BP 110/78 | HR 94 | Temp 98.3°F | Resp 16 | Wt 147.2 lb

## 2017-08-04 DIAGNOSIS — R6889 Other general symptoms and signs: Secondary | ICD-10-CM

## 2017-08-04 LAB — POCT INFLUENZA A/B
INFLUENZA A, POC: NEGATIVE
INFLUENZA B, POC: NEGATIVE

## 2017-08-04 NOTE — Patient Instructions (Addendum)
Okay to take plain Tylenol (acetaminophen) per package directions) for aches and plain Benadryl or (diphendhydramine) per package direction  Try vitamin C (orange juice if not diabetic or vitamin C tablets) and drink green tea to help your immune system during your illness; get plenty of rest and hydration Out of work today and tomorrow  Influenza, Adult Influenza ("the flu") is an infection in the lungs, nose, and throat (respiratory tract). It is caused by a virus. The flu causes many common cold symptoms, as well as a high fever and body aches. It can make you feel very sick. The flu spreads easily from person to person (is contagious). Getting a flu shot (influenza vaccination) every year is the best way to prevent the flu. Follow these instructions at home:  Take over-the-counter and prescription medicines only as told by your doctor.  Use a cool mist humidifier to add moisture (humidity) to the air in your home. This can make it easier to breathe.  Rest as needed.  Drink enough fluid to keep your pee (urine) clear or pale yellow.  Cover your mouth and nose when you cough or sneeze.  Wash your hands with soap and water often, especially after you cough or sneeze. If you cannot use soap and water, use hand sanitizer.  Stay home from work or school as told by your doctor. Unless you are visiting your doctor, try to avoid leaving home until your fever has been gone for 24 hours without the use of medicine.  Keep all follow-up visits as told by your doctor. This is important. How is this prevented?  Getting a yearly (annual) flu shot is the best way to avoid getting the flu. You may get the flu shot in late summer, fall, or winter. Ask your doctor when you should get your flu shot.  Wash your hands often or use hand sanitizer often.  Avoid contact with people who are sick during cold and flu season.  Eat healthy foods.  Drink plenty of fluids.  Get enough sleep.  Exercise  regularly. Contact a doctor if:  You get new symptoms.  You have: ? Chest pain. ? Watery poop (diarrhea). ? A fever.  Your cough gets worse.  You start to have more mucus.  You feel sick to your stomach (nauseous).  You throw up (vomit). Get help right away if:  You start to be short of breath or have trouble breathing.  Your skin or nails turn a bluish color.  You have very bad pain or stiffness in your neck.  You get a sudden headache.  You get sudden pain in your face or ear.  You cannot stop throwing up. This information is not intended to replace advice given to you by your health care provider. Make sure you discuss any questions you have with your health care provider. Document Released: 03/02/2008 Document Revised: 10/30/2015 Document Reviewed: 03/18/2015 Elsevier Interactive Patient Education  2017 [employer].

## 2017-08-04 NOTE — Progress Notes (Signed)
BP 110/78 (BP Location: Left Arm, Patient Position: Sitting, Cuff Size: Normal)   Pulse 94   Temp 98.3 F (36.8 C) (Oral)   Resp 16   Wt 147 lb 3.2 oz (66.8 kg)   LMP 08/01/2017 (Exact Date)   SpO2 96%   BMI 26.08 kg/m    Subjective:    Patient ID: Dana Wolf, female    DOB: 11/06/1970, 47 y.o.   MRN: [id number]  HPI: Dana Wolf is a 47 y.o. female  Chief Complaint  Patient presents with  . Influenza    congestion, cough, hot flashes, chills, headaches.   HPI Patient is here for an acute visit She started to get sick on: Sunday, worse on Monday and Tuesday Early symptoms included: chills, hot and cold, no appetite, nausea Other symptoms: headache, no energy, just laying around; everything got worse over the week; coughing Monday and Tuesday; blood tinged nasal drainage; some light sensitivity; diarrhea; breaking out into sweats Pertinent negatives: vomiting ; trouble swallowing, ear problems; chest pain, SHOB, rash Further details: tried to go to the store yesterday and felt like she was going to pass out when she was walking Remedies tried: no, "I'm not a medicine type person" Sick contacts: grandson, positive for flu; was exposed to him  Depression screen [facility] 2/9 08/04/2017 07/18/2017 08/02/2016 10/30/2015  Decreased Interest 0 0 0 0  Down, Depressed, Hopeless 0 0 0 0  PHQ - 2 Score 0 0 0 0    Relevant past medical, surgical, family and social history reviewed Past Medical History:  Diagnosis Date  . ASCUS with positive high risk HPV cervical 08/04/2016   Refer to GYN Feb 2018   Past Surgical History:  Procedure Laterality Date  . COLPOSCOPY    . NO PAST SURGERIES    . TUBAL LIGATION     Family History  Adopted: Yes   Social History   Tobacco Use  . Smoking status: Never Smoker  . Smokeless tobacco: Never Used  Substance Use Topics  . Alcohol use: No    Alcohol/week: 0.0 oz  . Drug use: No    Interim medical history since last visit  reviewed. Allergies and medications reviewed  Review of Systems Per HPI unless specifically indicated above     Objective:    BP 110/78 (BP Location: Left Arm, Patient Position: Sitting, Cuff Size: Normal)   Pulse 94   Temp 98.3 F (36.8 C) (Oral)   Resp 16   Wt 147 lb 3.2 oz (66.8 kg)   LMP 08/01/2017 (Exact Date)   SpO2 96%   BMI 26.08 kg/m   Wt Readings from Last 3 Encounters:  08/04/17 147 lb 3.2 oz (66.8 kg)  07/18/17 147 lb 12.8 oz (67 kg)  09/09/16 142 lb 7 oz (64.6 kg)    Physical Exam  Constitutional: She appears well-developed and well-nourished. No distress.  HENT:  Head: Normocephalic and atraumatic.  Right Ear: No drainage.  Left Ear: No drainage. Tympanic membrane is not injected and not erythematous.  Nose: Rhinorrhea (clear) present. Right sinus exhibits no maxillary sinus tenderness and no frontal sinus tenderness. Left sinus exhibits no maxillary sinus tenderness and no frontal sinus tenderness.  Mouth/Throat: No posterior oropharyngeal edema or posterior oropharyngeal erythema.  Some cerumen in right ear  Eyes: EOM are normal. No scleral icterus.  Neck: No thyromegaly present.  Cardiovascular: Normal rate, regular rhythm and normal heart sounds.  No murmur heard. Pulmonary/Chest: Effort normal and breath sounds normal. No respiratory  distress. She has no wheezes. She has no rhonchi.  Abdominal: Soft. Bowel sounds are normal. She exhibits no distension.  Musculoskeletal: Normal range of motion. She exhibits no edema.  Lymphadenopathy:    She has no cervical adenopathy.       Right cervical: No posterior cervical adenopathy present.      Left cervical: No posterior cervical adenopathy present.       Right: No supraclavicular adenopathy present.       Left: No supraclavicular adenopathy present.  Neurological: She is alert. She exhibits normal muscle tone.  Skin: Skin is warm and dry. She is not diaphoretic. No pallor.  Psychiatric: She has a normal  mood and affect. Her behavior is normal. Judgment normal. Her mood appears not anxious.      Assessment & Plan:   Problem List Items Addressed This Visit    None    Visit Diagnoses    Flu-like symptoms    -  Primary   rest and hydration; contagious; see AVS   Relevant Orders   POCT Influenza A/B (Completed)       Follow up plan: Return in about 2 weeks (around 08/18/2017) for complete physical.  An after-visit summary was printed and given to the patient at [facility].  Please see the patient instructions which may contain other information and recommendations beyond what is mentioned above in the assessment and plan.  No orders of the defined types were placed in this encounter.   Orders Placed This Encounter  Procedures  . POCT Influenza A/B

## 2017-08-08 ENCOUNTER — Telehealth: Payer: Self-pay | Admitting: Family Medicine

## 2017-08-08 NOTE — Telephone Encounter (Signed)
-----   Message from Arnetha Courser, MD sent at 09/09/2016  8:16 PM EDT ----- Regarding: due for pap in Feb 2019             1.  Recommend patient return to Dr. Sanda Klein for routine Pap cytology only in February. If worse than ASCUS re-colpo.  If ASCUS consider repeat cytology 6 months later.  Per GYN recommendations [place] 2018

## 2017-08-08 NOTE — Telephone Encounter (Signed)
Patient really needs that pap smear; please schedule her for wellness exam with pap in the next few weeks please and thank you

## 2017-08-09 NOTE — Telephone Encounter (Signed)
Mychart message sent.

## 2017-08-26 ENCOUNTER — Telehealth: Payer: Self-pay | Admitting: Family Medicine

## 2017-08-26 NOTE — Telephone Encounter (Signed)
-----   Message from Arnetha Courser, MD sent at 09/09/2016  8:16 PM EDT ----- Regarding: due for pap in Feb 2019             1.  Recommend patient return to Dr. Sanda Klein for routine Pap cytology only in February. If worse than ASCUS re-colpo.  If ASCUS consider repeat cytology 6 months later.  Per GYN recommendations [place] 2018

## 2017-08-26 NOTE — Telephone Encounter (Signed)
Please contact patient She is overdue for her repeat pap smear She can have that here or we'll recommend she go back to her GYN, but she really needs to get that done soon Thank you

## 2017-08-29 NOTE — Telephone Encounter (Signed)
Sent mychart message

## 2017-11-15 ENCOUNTER — Encounter: Payer: BLUE CROSS/BLUE SHIELD | Admitting: Family Medicine

## 2018-11-27 ENCOUNTER — Telehealth: Payer: Self-pay | Admitting: *Deleted

## 2018-11-27 ENCOUNTER — Encounter (INDEPENDENT_AMBULATORY_CARE_PROVIDER_SITE_OTHER): Payer: Self-pay

## 2018-11-27 ENCOUNTER — Encounter: Payer: Self-pay | Admitting: Physician Assistant

## 2018-11-27 ENCOUNTER — Telehealth: Payer: BC Managed Care – PPO | Admitting: Physician Assistant

## 2018-11-27 ENCOUNTER — Ambulatory Visit (INDEPENDENT_AMBULATORY_CARE_PROVIDER_SITE_OTHER)
Admission: RE | Admit: 2018-11-27 | Discharge: 2018-11-27 | Disposition: A | Discharge status: Home / Self Care | Payer: BC Managed Care – PPO | Source: Ambulatory Visit

## 2018-11-27 DIAGNOSIS — R059 Cough, unspecified: Secondary | ICD-10-CM

## 2018-11-27 DIAGNOSIS — Z20828 Contact with and (suspected) exposure to other viral communicable diseases: Secondary | ICD-10-CM

## 2018-11-27 DIAGNOSIS — Z20822 Contact with and (suspected) exposure to covid-19: Secondary | ICD-10-CM

## 2018-11-27 DIAGNOSIS — J029 Acute pharyngitis, unspecified: Secondary | ICD-10-CM

## 2018-11-27 DIAGNOSIS — G4489 Other headache syndrome: Secondary | ICD-10-CM

## 2018-11-27 DIAGNOSIS — R05 Cough: Secondary | ICD-10-CM

## 2018-11-27 DIAGNOSIS — R197 Diarrhea, unspecified: Secondary | ICD-10-CM

## 2018-11-27 DIAGNOSIS — R51 Headache: Secondary | ICD-10-CM | POA: Diagnosis not present

## 2018-11-27 DIAGNOSIS — M791 Myalgia, unspecified site: Secondary | ICD-10-CM

## 2018-11-27 MED ORDER — BENZONATATE 100 MG PO CAPS: 100.0000 mg | ORAL_CAPSULE | Freq: Two times a day (BID) | ORAL | 0 refills | Status: DC | PRN

## 2018-11-27 NOTE — Addendum Note (Signed)
Addended by: Waldon Merl on: 11/27/2018 04:07 PM   Modules accepted: Orders

## 2018-11-27 NOTE — Progress Notes (Signed)
E-Visit for Corona Virus Screening   Your current symptoms could be consistent with the coronavirus.  Call your health care provider or local health department to request and arrange formal testing. Many health care providers can now test patients at their office but not all are.  Please quarantine yourself while awaiting your test results.  [place] [phone], [place], [place] or visit [email or web address]     COVID-19 is a respiratory illness with symptoms that are similar to the flu. Symptoms are typically mild to moderate, but there have been cases of severe illness and death due to the virus. The following symptoms may appear 2-14 days after exposure: . Fever . Cough . Shortness of breath or difficulty breathing . Chills . Repeated shaking with chills . Muscle pain . Headache . Sore throat . New loss of taste or smell . Fatigue . Congestion or runny nose . Nausea or vomiting . Diarrhea  It is vitally important that if you feel that you have an infection such as this virus or any other virus that you stay home and away from places where you may spread it to others.  You should self-quarantine for 14 days if you have symptoms that could potentially be coronavirus or have been in close contact a with a person diagnosed with COVID-19 within the last 2 weeks. You should avoid contact with people age 86 and older.   You should wear a mask or cloth face covering over your nose and mouth if you must be around other people or animals, including pets (even at home). Try to stay at least 6 feet away from other people. This will protect the people around you.  You can use medication such as A prescription cough medication called Tessalon Perles 100 mg. You may take 1-2 capsules every 8 hours as needed for cough  You may also take  acetaminophen (Tylenol) as needed for fever.  I have also provided a work note   Reduce your risk of any infection by using the same precautions used for avoiding the common cold or flu:  Marland Kitchen Wash your hands often with soap and warm water for at least 20 seconds.  If soap and water are not readily available, use an alcohol-based hand sanitizer with at least 60% alcohol.  . If coughing or sneezing, cover your mouth and nose by coughing or sneezing into the elbow areas of your shirt or coat, into a tissue or into your sleeve (not your hands). . Avoid shaking hands with others and consider head nods or verbal greetings only. . Avoid touching your eyes, nose, or mouth with unwashed hands.  . Avoid close contact with people who are sick. . Avoid places or events with large numbers of people in one location, like concerts or sporting events. . Carefully consider travel plans you have or are making. . If you are planning any travel outside or inside the [place], visit the CDC's Travelers' Health webpage for the latest health notices. . If you have some symptoms but not all symptoms, continue to monitor at home and seek medical attention if your symptoms worsen. . If you are having a medical emergency, call 911.  HOME CARE . Only take medications as instructed by your medical team. . Drink plenty of fluids and get plenty of rest. . A steam or ultrasonic humidifier can help if you have congestion.   GET HELP RIGHT AWAY IF YOU HAVE EMERGENCY WARNING SIGNS**  FOR COVID-19. If you or someone is showing any of these signs seek emergency medical care immediately. Call 911 or proceed to your closest emergency facility if: . You develop worsening high fever. . Trouble breathing . Bluish lips or face . Persistent pain or pressure in the chest . New confusion . Inability to wake or stay awake . You cough up blood. . Your symptoms become more severe  **This list is not all possible symptoms. Contact your  medical provider for any symptoms that are sever or concerning to you.   MAKE SURE YOU   Understand these instructions.  Will watch your condition.  Will get help right away if you are not doing well or get worse.  Your e-visit answers were reviewed by a board certified advanced clinical practitioner to complete your personal care plan.  Depending on the condition, your plan could have included both over the counter or prescription medications.  If there is a problem please reply once you have received a response from your provider.  Your safety is important to [place].  If you have drug allergies check your prescription carefully.    You can use MyChart to ask questions about today's visit, request a non-urgent call back, or ask for a work or school excuse for 24 hours related to this e-Visit. If it has been greater than 24 hours you will need to follow up with your provider, or enter a new e-Visit to address those concerns. You will get an e-mail in the next two days asking about your experience.  I hope that your e-visit has been valuable and will speed your recovery. Thank you for using e-visits.   I spent 5-10 minutes on review and completion of this note- Lacy Duverney [facility]

## 2018-11-27 NOTE — Telephone Encounter (Signed)
Phone call to patient- left voicemail for patient to call back at [phone] to schedule COVID 19 test.  Test ordered.

## 2018-11-27 NOTE — ED Provider Notes (Signed)
Virtual Visit via Video Note:  Dana Wolf  initiated request for Telemedicine visit with [facility] team. I connected with Dana Wolf  on 11/27/2018 at 11:04 AM  for a synchronized telemedicine visit using a video enabled HIPPA compliant telemedicine application. I verified that I am speaking with Dana Wolf  using two identifiers. Orvan July, NP  was physically located in a [facility] Urgent care site and Dana Wolf was located at a different location.   The limitations of evaluation and management by telemedicine as well as the availability of in-person appointments were discussed. Patient was informed that she  may incur a bill ( including co-pay) for this virtual visit encounter. Dana Wolf  expressed understanding and gave verbal consent to proceed with virtual visit.     History of Present Illness:Dana Wolf  is a 48 y.o. female presents with headache and sore throat.  This is been constant for the last 2 days.  Reporting positive COVID exposure on Monday and Thursday of last week.  She has been taking Tylenol for her symptoms with some relief.  Denies any cough, chest congestion or fevers.  Past Medical History:  Diagnosis Date  . 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 HEAD: normocephalic. Non labored breathing, no dyspnea or distress Skin: Skin normal color  PSYCHIATRIC: The mental examination revealed the patient was oriented to person, place, and time. The patient was able to demonstrate good judgement and reason, without hallucinations, abnormal affect or abnormal behaviors during the examination. Patient is not suicidal     Assessment and Plan: Patient with positive COVID exposure.  Having headache and sore throat.  Will send for COVID testing  and have her quarantine until we get the results.  She can take over-the-counter medication for symptoms   Follow Up Instructions: Follow up as needed for continued or worsening symptoms    I discussed the assessment and treatment plan with the patient. The patient was provided an opportunity to ask questions and all were answered. The patient agreed with the plan and demonstrated an understanding of the instructions.   The patient was advised to call back or seek an in-person evaluation if the symptoms worsen or if the condition fails to improve as anticipated.    Orvan July, NP  11/27/2018 11:04 AM         Orvan July, NP 11/27/18 1304

## 2018-11-27 NOTE — Progress Notes (Signed)
E-Visit for Corona Virus Screening   You have been enrolled in [facility] for COVID-19.  Daily you will receive a questionnaire within the [place] [facility] website. Our COVID-19 response team will be monitoring your responses daily.

## 2018-11-27 NOTE — Telephone Encounter (Signed)
-----   Message from Orvan July, NP sent at 11/27/2018 11:03 AM EDT ----- Exposure to COVID, Sore throat, myalgias,  headaches

## 2018-11-27 NOTE — Discharge Instructions (Addendum)
We will send you for COVID testing.  You will need to stay quarantined until we get the results.  Work not provided  [facility] can take OTC meds for symptoms as needed.  Follow up as needed for continued or worsening symptoms'

## 2018-11-28 ENCOUNTER — Encounter (INDEPENDENT_AMBULATORY_CARE_PROVIDER_SITE_OTHER): Payer: Self-pay

## 2018-11-30 ENCOUNTER — Encounter (INDEPENDENT_AMBULATORY_CARE_PROVIDER_SITE_OTHER): Payer: Self-pay

## 2018-12-03 ENCOUNTER — Encounter (INDEPENDENT_AMBULATORY_CARE_PROVIDER_SITE_OTHER): Payer: Self-pay

## 2018-12-04 ENCOUNTER — Encounter (INDEPENDENT_AMBULATORY_CARE_PROVIDER_SITE_OTHER): Payer: Self-pay

## 2018-12-05 ENCOUNTER — Encounter (INDEPENDENT_AMBULATORY_CARE_PROVIDER_SITE_OTHER): Payer: Self-pay

## 2019-04-04 NOTE — Progress Notes (Signed)
Patient: Dana Wolf, Female    DOB: 03-Oct-1970, 48 y.o.   MRN: [id number] Arnetha Courser, MD Visit Date: 04/05/2019  Today's Provider: Delsa Grana, PA-C   Chief Complaint  Patient presents with  . Annual Exam   Subjective:   Annual physical exam:  Dana Wolf is a 48 y.o. female who presents today for health maintenance and annual & complete physical exam.   Exercise/Activity:   none  Diet/nutrition:  She cooks at home and also eats out - no particular diet  She reports she is sleeping well  USPSTF grade A and B recommendations - reviewed and addressed today  Depression:  Phq 9 completed today by patient, was reviewed by me with patient in the room, score is  negative, pt feels reviewed today PHQ 2/9 Scores 04/05/2019 08/04/2017 07/18/2017 08/02/2016  PHQ - 2 Score 0 0 0 0  PHQ- 9 Score 0 - - -   Depression screen [facility] 2/9 04/05/2019 08/04/2017 07/18/2017 08/02/2016 10/30/2015  Decreased Interest 0 0 0 0 0  Down, Depressed, Hopeless 0 0 0 0 0  PHQ - 2 Score 0 0 0 0 0  Altered sleeping 0 - - - -  Tired, decreased energy 0 - - - -  Change in appetite 0 - - - -  Feeling bad or failure about yourself  0 - - - -  Trouble concentrating 0 - - - -  Moving slowly or fidgety/restless 0 - - - -  Suicidal thoughts 0 - - - -  PHQ-9 Score 0 - - - -  Difficult doing work/chores Not difficult at all - - - -    Hep C Screening: not indicated STD testing and prevention (HIV/chl/gon/syphilis): HIV done 2018, no new sexual partners Intimate partner violence:  Feels safe Sexual History/Pain during Intercourse:  No pain, Married Menstrual History/LMP/Abnormal Bleeding:  03/20/2019 and then 04/05/2019, and two periods last month  No LMP recorded. Incontinence Symptoms: none  Breast cancer: last was 2008 BRCA gene screening: not known Cervical cancer screening: last PAP 2018 - abnormal with high risk HPV - went to OBGYN Family hx of cancers - breast, ovarian, uterine, colon -  unknown  Osteoporosis:   Discussed how to prevent osteoporosis  Skin cancer:  Skin CA hx no , Last skin survey:  2018   Colorectal cancer:   colonoscopy is -never done  Lung cancer:    Low Dose CT Chest recommended if Age 72-80 years, 30 pack-year currently smoking OR have quit w/in 15years. Patient does not qualify.   Social History   Tobacco Use  . Smoking status: Never Smoker  . Smokeless tobacco: Never Used  Substance Use Topics  . Alcohol use: No    Alcohol/week: 0.0 standard drinks    Alcohol screening:   [facility] from [facility]  AUDIT-C Score  0     ECG: none done in the past    Blood pressure/Hypertension: BP Readings from Last 3 Encounters:  04/05/19 118/70  08/04/17 110/78  07/18/17 112/72   Weight/Obesity: Wt Readings from Last 3 Encounters:  04/05/19 158 lb 8 oz (71.9 kg)  08/04/17 147 lb 3.2 oz (66.8 kg)  07/18/17 147 lb 12.8 oz (67 kg)   BMI Readings from Last 3 Encounters:  04/05/19 28.08 kg/m  08/04/17 26.08 kg/m  07/18/17 26.18 kg/m    Lipids:  Lab Results  Component Value Date   CHOL 146 08/02/2016   Lab Results  Component Value Date   HDL 48 (L) 08/02/2016   Lab Results  Component Value Date   LDLCALC 82 08/02/2016   Lab Results  Component Value Date   TRIG 79 08/02/2016   Lab Results  Component Value Date   CHOLHDL 3.0 08/02/2016   No results found for: LDLDIRECT Based on the results of lipid panel his/her cardiovascular risk factor ( using [facility] )  in the next 10 years is: The 10-year ASCVD risk score Mikey Bussing DC Brooke Bonito., et al., 2013) is: 0.8%   Values used to calculate the score:     Age: 57 years     Sex: Female     Is Non-Hispanic African American: Yes     Diabetic: No     Tobacco smoker: No     Systolic Blood Pressure: 811 mmHg     Is BP treated: No     HDL Cholesterol: 48 mg/dL     Total Cholesterol: 146 mg/dL Glucose:  Glucose, Bld  Date Value Ref Range Status   08/02/2016 91 65 - 99 mg/dL Final    Advanced Care Planning:  A voluntary discussion about advance care planning including the explanation and discussion of advance directives.   Discussed health care proxy and Living will, and the patient was able to identify a health care proxy as her husband - Keelan Pomerleau.   Patient does not have a living will at present time.  Social History      She  reports that she has never smoked. She has never used smokeless tobacco. She reports that she does not drink alcohol or use drugs.       Social History   Socioeconomic History  . Marital status: Married    Spouse name: Not on file  . Number of children: Not on file  . Years of education: Not on file  . Highest education level: Not on file  Occupational History  . Not on file  Social Needs  . Financial resource strain: Not on file  . Food insecurity    Worry: Not on file    Inability: Not on file  . Transportation needs    Medical: Not on file    Non-medical: Not on file  Tobacco Use  . Smoking status: Never Smoker  . Smokeless tobacco: Never Used  Substance and Sexual Activity  . Alcohol use: No    Alcohol/week: 0.0 standard drinks  . Drug use: No  . Sexual activity: Yes    Birth control/protection: Surgical  Lifestyle  . Physical activity    Days per week: Not on file    Minutes per session: Not on file  . Stress: Not on file  Relationships  . Social Herbalist on phone: Not on file    Gets together: Not on file    Attends religious service: Not on file    Active member of club or organization: Not on file    Attends meetings of clubs or organizations: Not on file    Relationship status: Not on file  Other Topics Concern  . Not on file  Social History Narrative  . Not on file    Family History        No family status information on file.        Her family history is not on file. She was adopted.       Family History  Adopted: Yes    Patient Active  Problem List   Diagnosis  Date Noted  . Heavy periods 07/18/2017  . Abnormal breast finding 03/10/2017  . ASCUS with positive high risk HPV cervical 08/04/2016  . Preventative health care 08/02/2016  . Cervical cancer screening 08/02/2016  . Irregular periods 10/30/2015  . Fatigue 10/30/2015    Past Surgical History:  Procedure Laterality Date  . COLPOSCOPY    . NO PAST SURGERIES    . TUBAL LIGATION       Current Outpatient Medications:  .  benzonatate (TESSALON) 100 MG capsule, Take 1 capsule (100 mg total) by mouth 2 (two) times daily as needed for cough. (Patient not taking: Reported on 04/05/2019), Disp: 20 capsule, Rfl: 0 .  naproxen (NAPROSYN) 375 MG tablet, Take 1 tablet (375 mg total) by mouth 2 (two) times daily with a meal. For inflammation and painful periods (Patient not taking: Reported on 04/05/2019), Disp: 60 tablet, Rfl: 0  No Known Allergies  Patient Care Team: Arnetha Courser, MD as PCP - General (Family Medicine)  I personally reviewed active problem list, medication list, allergies, family history, social history, health maintenance, notes from last encounter, lab results with the patient/caregiver today.  Review of Systems  Constitutional: Negative.  Negative for activity change, appetite change, fatigue and unexpected weight change.  HENT: Negative.   Eyes: Negative.   Respiratory: Negative.  Negative for shortness of breath.   Cardiovascular: Negative.  Negative for chest pain, palpitations and leg swelling.  Gastrointestinal: Negative.  Negative for abdominal pain and blood in stool.  Endocrine: Negative.   Genitourinary: Negative.   Musculoskeletal: Negative.  Negative for arthralgias, gait problem, joint swelling and myalgias.  Skin: Negative.  Negative for color change, pallor and rash.  Allergic/Immunologic: Negative.   Neurological: Negative.  Negative for syncope and weakness.  Hematological: Negative.   Psychiatric/Behavioral: Negative.   Negative for confusion, dysphoric mood, self-injury and suicidal ideas. The patient is not nervous/anxious.           Objective:   Vitals:  Vitals:   04/05/19 1515  BP: 118/70  Pulse: 87  Resp: 14  Temp: (!) 96.9 F (36.1 C)  SpO2: 96%  Weight: 158 lb 8 oz (71.9 kg)  Height: 5' 3"  (1.6 m)    Body mass index is 28.08 kg/m.  Physical Exam Vitals signs and nursing note reviewed.  Constitutional:      General: She is not in acute distress.    Appearance: Normal appearance. She is well-developed. She is not ill-appearing, toxic-appearing or diaphoretic.     Interventions: Face mask in place.  HENT:     Head: Normocephalic and atraumatic.     Right Ear: External ear normal.     Left Ear: External ear normal.  Eyes:     General: Lids are normal. No scleral icterus.       Right eye: No discharge.        Left eye: No discharge.     Conjunctiva/sclera: Conjunctivae normal.  Neck:     Musculoskeletal: Normal range of motion and neck supple.     Thyroid: No thyroid mass, thyromegaly or thyroid tenderness.     Trachea: Phonation normal. No tracheal deviation.  Cardiovascular:     Rate and Rhythm: Normal rate and regular rhythm.     Pulses: Normal pulses.          Radial pulses are 2+ on the right side and 2+ on the left side.       Posterior tibial pulses are 2+ on the right side and  2+ on the left side.     Heart sounds: Normal heart sounds. No murmur. No friction rub. No gallop.   Pulmonary:     Effort: Pulmonary effort is normal. No respiratory distress.     Breath sounds: Normal breath sounds. No stridor. No wheezing, rhonchi or rales.  Chest:     Chest wall: No tenderness.  Abdominal:     General: Bowel sounds are normal. There is no distension.     Palpations: Abdomen is soft.     Tenderness: There is no abdominal tenderness. There is no guarding or rebound.  Musculoskeletal: Normal range of motion.        General: No deformity.     Right lower leg: No edema.      Left lower leg: No edema.  Lymphadenopathy:     Cervical: No cervical adenopathy.  Skin:    General: Skin is warm and dry.     Capillary Refill: Capillary refill takes less than 2 seconds.     Coloration: Skin is not jaundiced or pale.     Findings: No rash.  Neurological:     Mental Status: She is alert and oriented to person, place, and time.     Motor: No abnormal muscle tone.     Gait: Gait normal.  Psychiatric:        Speech: Speech normal.        Behavior: Behavior normal.      Fall Risk: Fall Risk  04/05/2019 08/04/2017 07/18/2017 08/02/2016 10/30/2015  Falls in the past year? 0 No No No No  Number falls in past yr: 0 - - - -  Injury with Fall? 0 - - - -    Functional Status Survey: Is the patient deaf or have difficulty hearing?: No Does the patient have difficulty seeing, even when wearing glasses/contacts?: No Does the patient have difficulty concentrating, remembering, or making decisions?: No Does the patient have difficulty walking or climbing stairs?: No Does the patient have difficulty dressing or bathing?: No Does the patient have difficulty doing errands alone such as visiting a doctor's office or shopping?: No   Assessment & Plan:    CPE completed today  . USPSTF grade A and B recommendations reviewed with patient; age-appropriate recommendations, preventive care, screening tests, etc discussed and encouraged; healthy living encouraged; see AVS for patient education given to patient  . Discussed importance of 150 minutes of physical activity weekly, AHA exercise recommendations given to pt in AVS/handout  . Discussed importance of healthy diet:  eating lean meats and proteins, avoiding trans fats and saturated fats, avoid simple sugars and excessive carbs in diet, eat 6 servings of fruit/vegetables daily and drink plenty of water and avoid sweet beverages.    . Recommended pt to do annual eye exam and routine dental exams/cleanings  . Depression, alcohol,  fall screening completed as documented above and per flowsheets  . Reviewed Health Maintenance: Health Maintenance  Topic Date Due  . PAP SMEAR-Modifier  08/02/2017  . INFLUENZA VACCINE  09/05/2019 (Originally 01/06/2019)  . TETANUS/TDAP  04/04/2020 (Originally 12/24/1991)  . HIV Screening  Completed    . Immunizations:  There is no immunization history on file for this patient.  1. Adult general medical exam Done today - COMPLETE METABOLIC PANEL WITH GFR - CBC with Differential/Platelet - Hemoglobin A1c - Lipid panel - TSH - MM 3D SCREEN BREAST BILATERAL; Future  2. Encounter for screening mammogram for malignant neoplasm of breast Overdue for mammo - MM 3D  SCREEN BREAST BILATERAL; Future  3. Screening for colon cancer For AA female, recommended screening - Ambulatory referral to Gastroenterology  4. Screening for lipoid disorders Last lipids good, recheck - COMPLETE METABOLIC PANEL WITH GFR - Lipid panel  5. Screening for endocrine, metabolic and immunity disorder - CBC with Differential/Platelet - Hemoglobin A1c - Lipid panel - TSH  6. Irregular menstrual cycle For the last several months having 2 periods, one more regular and one lighter, refer back to GYN - Ambulatory referral to Obstetrics / Gynecology  7. ASCUS with positive high risk HPV cervical With other GYN issues, defer back to OBGYN for f/up  - Ambulatory referral to Obstetrics / Gynecology    Delsa Grana, PA-C 04/05/19 4:11 PM  [facility]

## 2019-04-05 ENCOUNTER — Other Ambulatory Visit: Payer: Self-pay

## 2019-04-05 ENCOUNTER — Encounter: Payer: Self-pay | Admitting: Family Medicine

## 2019-04-05 ENCOUNTER — Ambulatory Visit (INDEPENDENT_AMBULATORY_CARE_PROVIDER_SITE_OTHER): Payer: BC Managed Care – PPO | Admitting: Family Medicine

## 2019-04-05 VITALS — BP 118/70 | HR 87 | Temp 96.9°F | Resp 14 | Ht 63.0 in | Wt 158.5 lb

## 2019-04-05 DIAGNOSIS — Z130 Encounter for screening for diseases of the blood and blood-forming organs and certain disorders involving the immune mechanism: Secondary | ICD-10-CM

## 2019-04-05 DIAGNOSIS — Z0000 Encounter for general adult medical examination without abnormal findings: Secondary | ICD-10-CM

## 2019-04-05 DIAGNOSIS — R5383 Other fatigue: Secondary | ICD-10-CM | POA: Diagnosis not present

## 2019-04-05 DIAGNOSIS — Z1231 Encounter for screening mammogram for malignant neoplasm of breast: Secondary | ICD-10-CM | POA: Diagnosis not present

## 2019-04-05 DIAGNOSIS — Z13220 Encounter for screening for lipoid disorders: Secondary | ICD-10-CM

## 2019-04-05 DIAGNOSIS — Z1329 Encounter for screening for other suspected endocrine disorder: Secondary | ICD-10-CM

## 2019-04-05 DIAGNOSIS — N926 Irregular menstruation, unspecified: Secondary | ICD-10-CM

## 2019-04-05 DIAGNOSIS — R87610 Atypical squamous cells of undetermined significance on cytologic smear of cervix (ASC-US): Secondary | ICD-10-CM

## 2019-04-05 DIAGNOSIS — R002 Palpitations: Secondary | ICD-10-CM | POA: Diagnosis not present

## 2019-04-05 DIAGNOSIS — R87810 Cervical high risk human papillomavirus (HPV) DNA test positive: Secondary | ICD-10-CM

## 2019-04-05 DIAGNOSIS — Z1211 Encounter for screening for malignant neoplasm of colon: Secondary | ICD-10-CM

## 2019-04-05 DIAGNOSIS — Z13228 Encounter for screening for other metabolic disorders: Secondary | ICD-10-CM

## 2019-04-05 NOTE — Patient Instructions (Signed)

## 2019-04-06 ENCOUNTER — Other Ambulatory Visit: Payer: Self-pay | Admitting: Family Medicine

## 2019-04-06 DIAGNOSIS — N6320 Unspecified lump in the left breast, unspecified quadrant: Secondary | ICD-10-CM

## 2019-04-06 LAB — CBC WITH DIFFERENTIAL/PLATELET
Absolute Monocytes: 699 cells/uL (ref 200–950)
Basophils Absolute: 30 cells/uL (ref 0–200)
Basophils Relative: 0.4 %
Eosinophils Absolute: 61 cells/uL (ref 15–500)
Eosinophils Relative: 0.8 %
HCT: 37.8 % (ref 35.0–45.0)
Hemoglobin: 12.7 g/dL (ref 11.7–15.5)
Lymphs Abs: 2181 cells/uL (ref 850–3900)
MCH: 31.8 pg (ref 27.0–33.0)
MCHC: 33.6 g/dL (ref 32.0–36.0)
MCV: 94.7 fL (ref 80.0–100.0)
MPV: 11.3 fL (ref 7.5–12.5)
Monocytes Relative: 9.2 %
Neutro Abs: 4628 cells/uL (ref 1500–7800)
Neutrophils Relative %: 60.9 %
Platelets: 298 10*3/uL (ref 140–400)
RBC: 3.99 10*6/uL (ref 3.80–5.10)
RDW: 11.8 % (ref 11.0–15.0)
Total Lymphocyte: 28.7 %
WBC: 7.6 10*3/uL (ref 3.8–10.8)

## 2019-04-06 LAB — COMPLETE METABOLIC PANEL WITH GFR
AG Ratio: 1.6 (calc) (ref 1.0–2.5)
ALT: 11 U/L (ref 6–29)
AST: 11 U/L (ref 10–35)
Albumin: 4.2 g/dL (ref 3.6–5.1)
Alkaline phosphatase (APISO): 61 U/L (ref 31–125)
BUN: 10 mg/dL (ref 7–25)
CO2: 30 mmol/L (ref 20–32)
Calcium: 9.5 mg/dL (ref 8.6–10.2)
Chloride: 105 mmol/L (ref 98–110)
Creat: 0.67 mg/dL (ref 0.50–1.10)
GFR, Est African American: 122 mL/min/{1.73_m2} (ref >=60)
GFR, Est Non African American: 105 mL/min/{1.73_m2} (ref >=60)
Globulin: 2.6 g/dL (calc) (ref 1.9–3.7)
Glucose, Bld: 94 mg/dL (ref 65–99)
Potassium: 4.2 mmol/L (ref 3.5–5.3)
Sodium: 141 mmol/L (ref 135–146)
Total Bilirubin: 0.2 mg/dL (ref 0.2–1.2)
Total Protein: 6.8 g/dL (ref 6.1–8.1)

## 2019-04-06 LAB — HEMOGLOBIN A1C
Hgb A1c MFr Bld: 5.3 % of total Hgb (ref <5.7)
Mean Plasma Glucose: 105 (calc)
eAG (mmol/L): 5.8 (calc)

## 2019-04-06 LAB — LIPID PANEL
Cholesterol: 179 mg/dL (ref <200)
HDL: 53 mg/dL (ref >=50)
LDL Cholesterol (Calc): 105 mg/dL (calc) — ABNORMAL HIGH
Non-HDL Cholesterol (Calc): 126 mg/dL (calc) (ref <130)
Total CHOL/HDL Ratio: 3.4 (calc) (ref <5.0)
Triglycerides: 117 mg/dL (ref <150)

## 2019-04-06 LAB — TSH: TSH: 1.31 mIU/L

## 2019-04-09 ENCOUNTER — Encounter: Payer: Self-pay | Admitting: Family Medicine

## 2019-04-19 ENCOUNTER — Encounter: Payer: Self-pay | Admitting: *Deleted

## 2019-04-23 ENCOUNTER — Other Ambulatory Visit (HOSPITAL_COMMUNITY)
Admission: RE | Admit: 2019-04-23 | Discharge: 2019-04-23 | Disposition: A | Discharge status: Home / Self Care | Payer: BC Managed Care – PPO | Source: Ambulatory Visit | Attending: Certified Nurse Midwife | Admitting: Certified Nurse Midwife

## 2019-04-23 ENCOUNTER — Encounter: Payer: Self-pay | Admitting: Certified Nurse Midwife

## 2019-04-23 ENCOUNTER — Ambulatory Visit (INDEPENDENT_AMBULATORY_CARE_PROVIDER_SITE_OTHER): Payer: BC Managed Care – PPO | Admitting: Certified Nurse Midwife

## 2019-04-23 ENCOUNTER — Other Ambulatory Visit: Payer: Self-pay

## 2019-04-23 VITALS — BP 121/77 | HR 82 | Ht 63.0 in | Wt 159.9 lb

## 2019-04-23 DIAGNOSIS — N921 Excessive and frequent menstruation with irregular cycle: Secondary | ICD-10-CM

## 2019-04-23 DIAGNOSIS — Z01419 Encounter for gynecological examination (general) (routine) without abnormal findings: Secondary | ICD-10-CM

## 2019-04-23 DIAGNOSIS — Z124 Encounter for screening for malignant neoplasm of cervix: Secondary | ICD-10-CM

## 2019-04-23 DIAGNOSIS — R87810 Cervical high risk human papillomavirus (HPV) DNA test positive: Secondary | ICD-10-CM | POA: Insufficient documentation

## 2019-04-23 DIAGNOSIS — R87610 Atypical squamous cells of undetermined significance on cytologic smear of cervix (ASC-US): Secondary | ICD-10-CM | POA: Insufficient documentation

## 2019-04-23 DIAGNOSIS — N3941 Urge incontinence: Secondary | ICD-10-CM

## 2019-04-23 DIAGNOSIS — N926 Irregular menstruation, unspecified: Secondary | ICD-10-CM | POA: Diagnosis not present

## 2019-04-23 NOTE — Progress Notes (Signed)
ANNUAL PREVENTATIVE CARE GYN  ENCOUNTER NOTE  Subjective:       Dana Wolf is a 48 y.o. [phone] female here for a routine annual gynecologic exam.  Current complaints:  1. Irregular menses occurring every two (2) weeks for the last two (2) months  2. Urge incontinence, use to be able to hold urine "all day"; now has "to go when she has to go"  History abnormal Pap in 2018 with normal colposcopy.   Denies difficulty breathing or respiratory distress, chest pain, abdominal pain, dysuria, and leg pain or swelling.    Gynecologic History  Patient's last menstrual period was 04/04/2019 (exact date). Period Duration (Days): 5 Period Pattern: (!) Irregular Menstrual Flow: Heavy, Light Menstrual Control: Thin pad Dysmenorrhea: (!) Moderate Dysmenorrhea Symptoms: Cramping, Nausea  Contraception: tubal ligation  Last Pap: 08/02/2016. Results were: abnormal, ASCUS/HPV+  Last mammogram: 09/2016. Results were: BI-RADS 3  Obstetric History  OB History  Gravida Para Term Preterm AB Living  3 3 2 1   3   SAB TAB Ectopic Multiple Live Births          3    # Outcome Date GA Lbr Len/2nd Weight Sex Delivery Anes PTL Lv  3 Term 02/28/98   6 lb 1 oz (2.75 kg) F Vag-Spont None  LIV  2 Preterm 10/29/95 [redacted]w[redacted]d  5 lb 5 oz (2.41 kg) M Vag-Spont None  LIV     Complications: Ruptured, membranes, premature  1 Term 02/16/92   7 lb 1 oz (3.204 kg) F Vag-Spont None  LIV    Past Medical History:  Diagnosis Date  . 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 COLPOSCOPY    . TUBAL LIGATION      No Known Allergies  Social History   Socioeconomic History  . Marital status: Married    Spouse name: Not on file  . Number of children: Not on file  . Years of education: Not on file  . Highest education level: Not on file  Occupational History  . Not on file  Social Needs  . Financial resource strain:  Not on file  . Food insecurity    Worry: Not on file    Inability: Not on file  . Transportation needs    Medical: Not on file    Non-medical: Not on file  Tobacco Use  . Smoking status: Never Smoker  . Smokeless tobacco: Never Used  Substance and Sexual Activity  . Alcohol use: No    Alcohol/week: 0.0 standard drinks  . Drug use: No  . Sexual activity: Yes    Birth control/protection: Surgical    Comment: tubal ligation   Lifestyle  . Physical activity    Days per week: Not on file    Minutes per session: Not on file  . Stress: Not on file  Relationships  . Social Herbalist on phone: Not on file    Gets together: Not on file    Attends religious service: Not on file    Active member of club or organization: Not on file    Attends meetings of clubs or organizations: Not on file    Relationship status: Not on file  . Intimate partner violence    Fear of current or ex partner: Not on file    Emotionally abused: Not on file    Physically abused: Not  on file    Forced sexual activity: Not on file  Other Topics Concern  . Not on file  Social History Narrative  . Not on file    Family History  Adopted: Yes  Problem Relation Age of Onset  . Breast cancer Neg Hx   . Ovarian cancer Neg Hx   . Colon cancer Neg Hx     The following portions of the patient's history were reviewed and updated as appropriate: allergies, current medications, past family history, past medical history, past social history, past surgical history and problem list.  Review of Systems  ROS negative except as noted above. Information obtained from patient.    Objective:   BP 121/77   Pulse 82   Ht 5\' 3"  (1.6 m)   Wt 159 lb 14.4 oz (72.5 kg)   LMP 04/04/2019 (Exact Date)   BMI 28.33 kg/m   CONSTITUTIONAL: Well-developed, well-nourished female in no acute distress.   PSYCHIATRIC: Normal mood and affect. Normal behavior. Normal judgment and thought content.  [place]: Alert and  oriented to person, place, and time. Normal muscle tone coordination. No cranial nerve deficit noted.  HENT:  Normocephalic, atraumatic, External right and left ear normal.   EYES: Conjunctivae and EOM are normal. Pupils are equal and round.   NECK: Normal range of motion, supple, no masses.  Normal thyroid.   SKIN: Skin is warm and dry. No rash noted. Not diaphoretic. No erythema. No pallor. Professional tattoo present.   CARDIOVASCULAR: Normal heart rate noted, regular rhythm, no murmur.  RESPIRATORY: Clear to auscultation bilaterally. Effort and breath sounds normal, no problems with respiration noted.  BREASTS: Symmetric in size. No masses, skin changes, nipple drainage, or lymphadenopathy.  ABDOMEN: Soft, normal bowel sounds, no distention noted.  No tenderness, rebound or guarding.   PELVIC:  External Genitalia: Normal  Vagina: Normal  Cervix: Normal, Pap collected  Uterus: Normal  Adnexa: Normal   MUSCULOSKELETAL: Normal range of motion. No tenderness.  No cyanosis, clubbing, or edema.  2+ distal pulses.  LYMPHATIC: No Axillary, Supraclavicular, or Inguinal Adenopathy.   Assessment:   Annual gynecologic examination 48 y.o.   Contraception: tubal ligation   Overweight   Problem List Items Addressed This Visit      Genitourinary   ASCUS with positive high risk HPV cervical   Relevant Orders   Cytology - PAP     Other   Irregular periods   Relevant Orders   CBC   Estradiol   Ferritin   FSH/LH   TSH   Cervical cancer screening   Relevant Orders   Cytology - PAP   Heavy periods   Relevant Orders   CBC   Estradiol   Ferritin   FSH/LH   TSH    Other Visit Diagnoses    Well woman exam    -  Primary   Relevant Orders   CBC   Estradiol   Ferritin   FSH/LH   TSH   Cytology - PAP      Plan:   Pap: Pap Co Test   Mammogram: Ordered by PCP  Labs: See orders, will contact patient with results   Routine preventative health maintenance measures  emphasized: Urge incontinence management techniques, Exercise/Diet/Weight control, Tobacco Warnings, Alcohol/Substance use risks, Stress Management and Peer Pressure Issues  Reviewed red flag symptoms and when to call  RTC x 1 year for ANNUAL EXAM or sooner if needed   Diona Fanti, CNM [facility], [facility] 04/23/19 2:26 PM

## 2019-04-23 NOTE — Patient Instructions (Addendum)
Dysfunctional Uterine Bleeding Dysfunctional uterine bleeding is abnormal bleeding from the uterus. Dysfunctional uterine bleeding includes:  A menstrual period that comes earlier or later than usual.  A menstrual period that is lighter or heavier than usual, or has large blood clots.  Vaginal bleeding between menstrual periods.  Skipping one or more menstrual periods.  Vaginal bleeding after sex.  Vaginal bleeding after menopause. Follow these instructions at home: Eating and drinking   Eat well-balanced meals. Include foods that are high in iron, such as liver, meat, shellfish, green leafy vegetables, and eggs.  To prevent or treat constipation, your health care provider may recommend that you: ? Drink enough fluid to keep your urine pale yellow. ? Take over-the-counter or prescription medicines. ? Eat foods that are high in fiber, such as beans, whole grains, and fresh fruits and vegetables. ? Limit foods that are high in fat and processed sugars, such as fried or sweet foods. Medicines  Take over-the-counter and prescription medicines only as told by your health care provider.  Do not change medicines without talking with your health care provider.  Aspirin or medicines that contain aspirin may make the bleeding worse. Do not take those medicines: ? During the week before your menstrual period. ? During your menstrual period.  If you were prescribed iron pills, take them as told by your health care provider. Iron pills help to replace iron that your body loses because of this condition. Activity  If you need to change your sanitary pad or tampon more than one time every 2 hours: ? Lie in bed with your feet raised (elevated). ? Place a cold pack on your lower abdomen. ? Rest as much as possible until the bleeding stops or slows down.  Do not try to lose weight until the bleeding has stopped and your blood iron level is back to normal. General instructions   For two  months, write down: ? When your menstrual period starts. ? When your menstrual period ends. ? When any abnormal vaginal bleeding occurs. ? What problems you notice.  Keep all follow up visits as told by your health care provider. This is important. Contact a health care provider if you:  Feel light-headed or weak.  Have nausea and vomiting.  Cannot eat or drink without vomiting.  Feel dizzy or have diarrhea while you are taking medicines.  Are taking birth control pills or hormones, and you want to change them or stop taking them. Get help right away if:  You develop a fever or chills.  You need to change your sanitary pad or tampon more than one time per hour.  Your vaginal bleeding becomes heavier, or your flow contains clots more often.  You develop pain in your abdomen.  You lose consciousness.  You develop a rash. Summary  Dysfunctional uterine bleeding is abnormal bleeding from the uterus.  It includes menstrual bleeding of abnormal duration, volume, or regularity.  Bleeding after sex and after menopause are also considered dysfunctional uterine bleeding. This information is not intended to replace advice given to you by your health care provider. Make sure you discuss any questions you have with your health care provider. Document Released: 05/21/2000 Document Revised: 11/02/2017 Document Reviewed: 11/02/2017 Elsevier Patient Education  2020 Elsevier Inc.   Preventive Care 60-22 Years Old, Female Preventive care refers to visits with your health care provider and lifestyle choices that can promote health and wellness. This includes:  A yearly physical exam. This may also be called an annual  well check.  Regular dental visits and eye exams.  Immunizations.  Screening for certain conditions.  Healthy lifestyle choices, such as eating a healthy diet, getting regular exercise, not using drugs or products that contain nicotine and tobacco, and limiting alcohol  use. What can I expect for my preventive care visit? Physical exam Your health care provider will check your:  Height and weight. This may be used to calculate body mass index (BMI), which tells if you are at a healthy weight.  Heart rate and blood pressure.  Skin for abnormal spots. Counseling Your health care provider may ask you questions about your:  Alcohol, tobacco, and drug use.  Emotional well-being.  Home and relationship well-being.  Sexual activity.  Eating habits.  Work and work Statistician.  Method of birth control.  Menstrual cycle.  Pregnancy history. What immunizations do I need?  Influenza (flu) vaccine  This is recommended every year. Tetanus, diphtheria, and pertussis (Tdap) vaccine  You may need a Td booster every 10 years. Varicella (chickenpox) vaccine  You may need this if you have not been vaccinated. Zoster (shingles) vaccine  You may need this after age 23. Measles, mumps, and rubella (MMR) vaccine  You may need at least one dose of MMR if you were born in 1957 or later. You may also need a second dose. Pneumococcal conjugate (PCV13) vaccine  You may need this if you have certain conditions and were not previously vaccinated. Pneumococcal polysaccharide (PPSV23) vaccine  You may need one or two doses if you smoke cigarettes or if you have certain conditions. Meningococcal conjugate (MenACWY) vaccine  You may need this if you have certain conditions. Hepatitis A vaccine  You may need this if you have certain conditions or if you travel or work in places where you may be exposed to hepatitis A. Hepatitis B vaccine  You may need this if you have certain conditions or if you travel or work in places where you may be exposed to hepatitis B. Haemophilus influenzae type b (Hib) vaccine  You may need this if you have certain conditions. Human papillomavirus (HPV) vaccine  If recommended by your health care provider, you may need  three doses over 6 months. You may receive vaccines as individual doses or as more than one vaccine together in one shot (combination vaccines). Talk with your health care provider about the risks and benefits of combination vaccines. What tests do I need? Blood tests  Lipid and cholesterol levels. These may be checked every 5 years, or more frequently if you are over 24 years old.  Hepatitis C test.  Hepatitis B test. Screening  Lung cancer screening. You may have this screening every year starting at age 69 if you have a 30-pack-year history of smoking and currently smoke or have quit within the past 15 years.  Colorectal cancer screening. All adults should have this screening starting at age 92 and continuing until age 68. Your health care provider may recommend screening at age 61 if you are at increased risk. You will have tests every 1-10 years, depending on your results and the type of screening test.  Diabetes screening. This is done by checking your blood sugar (glucose) after you have not eaten for a while (fasting). You may have this done every 1-3 years.  Mammogram. This may be done every 1-2 years. Talk with your health care provider about when you should start having regular mammograms. This may depend on whether you have a family history of  breast cancer.  BRCA-related cancer screening. This may be done if you have a family history of breast, ovarian, tubal, or peritoneal cancers.  Pelvic exam and Pap test. This may be done every 3 years starting at age 72. Starting at age 58, this may be done every 5 years if you have a Pap test in combination with an HPV test. Other tests  Sexually transmitted disease (STD) testing.  Bone density scan. This is done to screen for osteoporosis. You may have this scan if you are at high risk for osteoporosis. Follow these instructions at home: Eating and drinking  Eat a diet that includes fresh fruits and vegetables, whole grains, lean  protein, and low-fat dairy.  Take vitamin and mineral supplements as recommended by your health care provider.  Do not drink alcohol if: ? Your health care provider tells you not to drink. ? You are pregnant, may be pregnant, or are planning to become pregnant.  If you drink alcohol: ? Limit how much you have to 0-1 drink a day. ? Be aware of how much alcohol is in your drink. In the [place] one drink equals one 12 oz bottle of beer (355 mL), one 5 oz glass of wine (148 mL), or one 1 oz glass of hard liquor (44 mL). Lifestyle  Take daily care of your teeth and gums.  Stay active. Exercise for at least 30 minutes on 5 or more days each week.  Do not use any products that contain nicotine or tobacco, such as cigarettes, e-cigarettes, and chewing tobacco. If you need help quitting, ask your health care provider.  If you are sexually active, practice safe sex. Use a condom or other form of birth control (contraception) in order to prevent pregnancy and STIs (sexually transmitted infections).  If told by your health care provider, take low-dose aspirin daily starting at age 3. What's next?  Visit your health care provider once a year for a well check visit.  Ask your health care provider how often you should have your eyes and teeth checked.  Stay up to date on all vaccines. This information is not intended to replace advice given to you by your health care provider. Make sure you discuss any questions you have with your health care provider. Document Released: 06/20/2015 Document Revised: 02/02/2018 Document Reviewed: 02/02/2018 Elsevier Patient Education  [place].   Urinary Incontinence  Urinary incontinence refers to a condition in which a person is unable to control where and when to pass urine. A person with this condition will urinate when he or she does not mean to (involuntarily). What are the causes? This condition may be caused by: Medicines. Infections.  Constipation. Overactive bladder muscles. Weak bladder muscles. Weak pelvic floor muscles. These muscles provide support for the bladder, intestine, and, in women, the uterus. Enlarged prostate in men. The prostate is a gland near the bladder. When it gets too big, it can pinch the urethra. With the urethra blocked, the bladder can weaken and lose the ability to empty properly. Surgery. Emotional factors, such as anxiety, stress, or post-traumatic stress disorder (PTSD). Pelvic organ prolapse. This happens in women when organs shift out of place and into the vagina. This shift can prevent the bladder and urethra from working properly. What increases the risk? The following factors may make you more likely to develop this condition: Older age. Obesity and physical inactivity. Pregnancy and childbirth. Menopause. Diseases that affect the nerves or spinal cord (neurological diseases). Long-term (chronic) coughing.  This can increase pressure on the bladder and pelvic floor muscles. What are the signs or symptoms? Symptoms may vary depending on the type of urinary incontinence you have. They include: A sudden urge to urinate, but passing urine involuntarily before you can get to a bathroom (urge incontinence). Suddenly passing urine with any activity that forces urine to pass, such as coughing, laughing, exercise, or sneezing (stress incontinence). Needing to urinate often, but urinating only a small amount, or constantly dribbling urine (overflow incontinence). Urinating because you cannot get to the bathroom in time due to a physical disability, such as arthritis or injury, or communication and thinking problems, such as Alzheimer disease (functional incontinence). How is this diagnosed? This condition may be diagnosed based on: Your medical history. A physical exam. Tests, such as: Urine tests. X-rays of your kidney and bladder. Ultrasound. CT scan. Cystoscopy. In this procedure, a  health care provider inserts a tube with a light and camera (cystoscope) through the urethra and into the bladder in order to check for problems. Urodynamic testing. These tests assess how well the bladder, urethra, and sphincter can store and release urine. There are different types of urodynamic tests, and they vary depending on what the test is measuring. To help diagnose your condition, your health care provider may recommend that you keep a log of when you urinate and how much you urinate. How is this treated? Treatment for this condition depends on the type of incontinence that you have and its cause. Treatment may include: Lifestyle changes, such as: Quitting smoking. Maintaining a healthy weight. Staying active. Try to get 150 minutes of moderate-intensity exercise every week. Ask your health care provider which activities are safe for you. Eating a healthy diet. Avoid high-fat foods, like fried foods. Avoid refined carbohydrates like white bread and white rice. Limit how much alcohol and caffeine you drink. Increase your fiber intake. Foods such as fresh fruits, vegetables, beans, and whole grains are healthy sources of fiber. Pelvic floor muscle exercises. Bladder training, such as lengthening the amount of time between bathroom breaks, or using the bathroom at regular intervals. Using techniques to suppress bladder urges. This can include distraction techniques or controlled breathing exercises. Medicines to relax the bladder muscles and prevent bladder spasms. Medicines to help slow or prevent the growth of a man's prostate. Botox injections. These can help relax the bladder muscles. Using pulses of electricity to help change bladder reflexes (electrical nerve stimulation). For women, using a medical device to prevent urine leaks. This is a small, tampon-like, disposable device that is inserted into the urethra. Injecting collagen or carbon beads (bulking agents) into the urinary  sphincter. These can help thicken tissue and close the bladder opening. Surgery. Follow these instructions at home: Lifestyle Limit alcohol and caffeine. These can fill your bladder quickly and irritate it. Keep yourself clean to help prevent odors and skin damage. Ask your doctor about special skin creams and cleansers that can protect the skin from urine. Consider wearing pads or adult diapers. Make sure to change them regularly, and always change them right after experiencing incontinence. General instructions Take over-the-counter and prescription medicines only as told by your health care provider. Use the bathroom about every 3-4 hours, even if you do not feel the need to urinate. Try to empty your bladder completely every time. After urinating, wait a minute. Then try to urinate again. Make sure you are in a relaxed position while urinating. If your incontinence is caused by nerve problems, keep  a log of the medicines you take and the times you go to the bathroom. Keep all follow-up visits as told by your health care provider. This is important. Contact a health care provider if: You have pain that gets worse. Your incontinence gets worse. Get help right away if: You have a fever or chills. You are unable to urinate. You have redness in your groin area or down your legs. Summary Urinary incontinence refers to a condition in which a person is unable to control where and when to pass urine. This condition may be caused by medicines, infection, weak bladder muscles, weak pelvic floor muscles, enlargement of the prostate (in men), or surgery. The following factors increase your risk for developing this condition: older age, obesity, pregnancy and childbirth, menopause, neurological diseases, and chronic coughing. There are several types of urinary incontinence. They include urge incontinence, stress incontinence, overflow incontinence, and functional incontinence. This condition is  usually treated first with lifestyle and behavioral changes, such as quitting smoking, eating a healthier diet, and doing regular pelvic floor exercises. Other treatment options include medicines, bulking agents, medical devices, electrical nerve stimulation, or surgery. This information is not intended to replace advice given to you by your health care provider. Make sure you discuss any questions you have with your health care provider. Document Released: 07/01/2004 Document Revised: 06/03/2017 Document Reviewed: 09/02/2016 Elsevier Patient Education  2020 [employer].

## 2019-04-23 NOTE — Progress Notes (Signed)
Patient here for annual exam.  Patient c/o irregular menstrual periods x2 months, coming every 2 weeks, flow alternates between heavy with clots to light.

## 2019-04-24 LAB — FSH/LH
FSH: 2.3 m[IU]/mL
LH: 3.2 m[IU]/mL

## 2019-04-24 LAB — TSH: TSH: 1.4 u[IU]/mL (ref 0.450–4.500)

## 2019-04-24 LAB — CBC
Hematocrit: 36.6 % (ref 34.0–46.6)
Hemoglobin: 12.6 g/dL (ref 11.1–15.9)
MCH: 31.3 pg (ref 26.6–33.0)
MCHC: 34.4 g/dL (ref 31.5–35.7)
MCV: 91 fL (ref 79–97)
Platelets: 299 10*3/uL (ref 150–450)
RBC: 4.03 x10E6/uL (ref 3.77–5.28)
RDW: 11.8 % (ref 11.7–15.4)
WBC: 10.2 10*3/uL (ref 3.4–10.8)

## 2019-04-24 LAB — ESTRADIOL: Estradiol: 754 pg/mL

## 2019-04-24 LAB — FERRITIN: Ferritin: 42 ng/mL (ref 15–150)

## 2019-04-26 ENCOUNTER — Encounter: Payer: Self-pay | Admitting: Certified Nurse Midwife

## 2019-04-27 LAB — CYTOLOGY - PAP
Comment: NEGATIVE
Comment: NEGATIVE
Comment: NEGATIVE
HPV 16: NEGATIVE
HPV 18 / 45: NEGATIVE
High risk HPV: POSITIVE — AB

## 2019-05-01 ENCOUNTER — Other Ambulatory Visit: Payer: Self-pay

## 2019-05-01 ENCOUNTER — Other Ambulatory Visit: Payer: Self-pay | Admitting: Certified Nurse Midwife

## 2019-05-01 ENCOUNTER — Ambulatory Visit (INDEPENDENT_AMBULATORY_CARE_PROVIDER_SITE_OTHER): Payer: BC Managed Care – PPO

## 2019-05-01 DIAGNOSIS — N83202 Unspecified ovarian cyst, left side: Secondary | ICD-10-CM | POA: Diagnosis not present

## 2019-05-01 DIAGNOSIS — N939 Abnormal uterine and vaginal bleeding, unspecified: Secondary | ICD-10-CM | POA: Diagnosis not present

## 2019-05-01 DIAGNOSIS — D251 Intramural leiomyoma of uterus: Secondary | ICD-10-CM

## 2019-05-09 ENCOUNTER — Encounter: Payer: Self-pay | Admitting: Certified Nurse Midwife

## 2019-05-10 ENCOUNTER — Encounter: Payer: Self-pay | Admitting: Certified Nurse Midwife

## 2019-05-23 ENCOUNTER — Other Ambulatory Visit (HOSPITAL_COMMUNITY)
Admission: RE | Admit: 2019-05-23 | Discharge: 2019-05-23 | Disposition: A | Discharge status: Home / Self Care | Payer: BC Managed Care – PPO | Source: Ambulatory Visit | Attending: Obstetrics and Gynecology | Admitting: Obstetrics and Gynecology

## 2019-05-23 ENCOUNTER — Ambulatory Visit (INDEPENDENT_AMBULATORY_CARE_PROVIDER_SITE_OTHER): Payer: BC Managed Care – PPO | Admitting: Obstetrics and Gynecology

## 2019-05-23 ENCOUNTER — Encounter: Payer: Self-pay | Admitting: Obstetrics and Gynecology

## 2019-05-23 ENCOUNTER — Other Ambulatory Visit: Payer: Self-pay

## 2019-05-23 VITALS — BP 114/78 | HR 85 | Ht 64.0 in | Wt 163.5 lb

## 2019-05-23 DIAGNOSIS — N921 Excessive and frequent menstruation with irregular cycle: Secondary | ICD-10-CM | POA: Diagnosis not present

## 2019-05-23 DIAGNOSIS — N72 Inflammatory disease of cervix uteri: Secondary | ICD-10-CM | POA: Diagnosis not present

## 2019-05-23 DIAGNOSIS — D251 Intramural leiomyoma of uterus: Secondary | ICD-10-CM | POA: Diagnosis not present

## 2019-05-23 DIAGNOSIS — R87612 Low grade squamous intraepithelial lesion on cytologic smear of cervix (LGSIL): Secondary | ICD-10-CM

## 2019-05-23 DIAGNOSIS — N870 Mild cervical dysplasia: Secondary | ICD-10-CM | POA: Diagnosis not present

## 2019-05-23 DIAGNOSIS — B977 Papillomavirus as the cause of diseases classified elsewhere: Secondary | ICD-10-CM | POA: Insufficient documentation

## 2019-05-23 NOTE — Progress Notes (Signed)
Patient comes in today for colposcopy and to discuss [place] results.

## 2019-05-23 NOTE — Progress Notes (Signed)
Referring Provider:  JML  HPI:  Dana Wolf is a 48 y.o.  [phone]  who presents today for evaluation and management of abnormal cervical cytology.    Dysplasia History:  LGSIL  -  Positive HPV  Patient has also been followed for menorrhagia with regular cycle.  She had an ultrasound showing a uterine fibroid.  She would like to discuss this.  ROS:  Pertinent items are noted in HPI.  OB History  Gravida Para Term Preterm AB Living  3 3 2 1   3   SAB TAB Ectopic Multiple Live Births          3    # Outcome Date GA Lbr Len/2nd Weight Sex Delivery Anes PTL Lv  3 Term 02/28/98   6 lb 1 oz (2.75 kg) F Vag-Spont None  LIV  2 Preterm 10/29/95 [redacted]w[redacted]d  5 lb 5 oz (2.41 kg) M Vag-Spont None  LIV     Complications: Ruptured, membranes, premature  1 Term 02/16/92   7 lb 1 oz (3.204 kg) F Vag-Spont None  LIV    Past Medical History:  Diagnosis Date  . 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 COLPOSCOPY    . TUBAL LIGATION      SOCIAL HISTORY: Social History   Substance and Sexual Activity  Alcohol Use No  . Alcohol/week: 0.0 standard drinks   Social History   Substance and Sexual Activity  Drug Use No     Family History  Adopted: Yes  Problem Relation Age of Onset  . Breast cancer Neg Hx   . Ovarian cancer Neg Hx   . Colon cancer Neg Hx     ALLERGIES:  Patient has no known allergies.  She currently has no medications in their medication list.  Physical Exam: -Vitals:  BP 114/78   Pulse 85   Ht 5\' 4"  (1.626 m)   Wt 163 lb 8 oz (74.2 kg)   LMP 05/14/2019   BMI 28.06 kg/m    PROCEDURE:   Colposcopy performed with 4% acetic acid after verbal consent obtained                                        -Biopsy performed at 4 and 10 o'clock               -ECC indicated and performed: No.     -Biopsy sites made hemostatic with pressure and Monsel's solution   -Satisfactory  colposcopy: Yes.      -Evidence of Invasive cervical CA :  NO  ASSESSMENT:  Dana Wolf is a 48 y.o. [id number] here for  1. Menorrhagia with irregular cycle   2. Fibroids, intramural   3. Low grade squamous intraepithelial lesion on cytologic smear of cervix (LGSIL)   4. High risk human papilloma virus (HPV) infection of cervix   .  PLAN: 1.  I discussed the grading system of pap smears and HPV high risk viral types.  We will discuss management after colpo results return. 2.  Fibroids Uterine fibroids were discussed in detail.  The natural course and history of fibroids were reviewed.  Multiple treatment options were also discussed including NSAIDS, hormonal options and hysterectomy.  Menopause and its effect on fibroids was also reviewed. 3.  Ovarian cyst  We will discuss fibroids and ovarian cyst at her 2-week visit for colposcopy.  Consider management options.  Hormonal cycle control discussed at this visit.  Expectant management of ovarian cyst discussed with follow-up ultrasound at 6 weeks.   No orders of the defined types were placed in this encounter.          F/U  Return in about 2 weeks (around 06/06/2019) for Colpo f/u. I spent 20 minutes involved in the care of this patient of which greater than 50% was spent discussing dysfunctional uterine bleeding, uterine fibroids, ovarian cyst.  Dana Wolf ,MD 05/23/2019,9:49 AM

## 2019-05-23 NOTE — Addendum Note (Signed)
Addended by: Durwin Glaze on: 05/23/2019 10:39 AM   Modules accepted: Orders

## 2019-05-24 ENCOUNTER — Other Ambulatory Visit: Payer: Self-pay

## 2019-05-25 LAB — SURGICAL PATHOLOGY

## 2019-06-07 ENCOUNTER — Encounter: Payer: Self-pay | Admitting: Obstetrics and Gynecology

## 2019-06-07 ENCOUNTER — Ambulatory Visit (INDEPENDENT_AMBULATORY_CARE_PROVIDER_SITE_OTHER): Payer: BC Managed Care – PPO | Admitting: Obstetrics and Gynecology

## 2019-06-07 ENCOUNTER — Other Ambulatory Visit: Payer: Self-pay

## 2019-06-07 VITALS — BP 111/70 | HR 81 | Ht 64.0 in | Wt 161.4 lb

## 2019-06-07 DIAGNOSIS — N870 Mild cervical dysplasia: Secondary | ICD-10-CM

## 2019-06-07 NOTE — Progress Notes (Signed)
HPI:      [name] is a 48 y.o. [phone] who LMP was Patient's last menstrual period was 05/26/2019.  Subjective:   She presents today for colposcopy follow-up. She is also complaining of irregular bleeding with her menstrual period.  Sometimes it lasts 12 days with most of the days spotting days.    Hx: The following portions of the patient's history were reviewed and updated as appropriate:             She  has a past medical history of ASCUS with positive high risk HPV cervical (08/04/2016). She does not have any pertinent problems on file. She  has a past surgical history that includes Colposcopy and Tubal ligation. Her family history is not on file. She was adopted. She  reports that she has never smoked. She has never used smokeless tobacco. She reports that she does not drink alcohol or use drugs. She currently has no medications in their medication list. She has No Known Allergies.       Review of Systems:  Review of Systems  Constitutional: Denied constitutional symptoms, night sweats, recent illness, fatigue, fever, insomnia and weight loss.  Eyes: Denied eye symptoms, eye pain, photophobia, vision change and visual disturbance.  Ears/Nose/Throat/Neck: Denied ear, nose, throat or neck symptoms, hearing loss, nasal discharge, sinus congestion and sore throat.  Cardiovascular: Denied cardiovascular symptoms, arrhythmia, chest pain/pressure, edema, exercise intolerance, orthopnea and palpitations.  Respiratory: Denied pulmonary symptoms, asthma, pleuritic pain, productive sputum, cough, dyspnea and wheezing.  Gastrointestinal: Denied, gastro-esophageal reflux, melena, nausea and vomiting.  Genitourinary: See HPI for additional information.  Musculoskeletal: Denied musculoskeletal symptoms, stiffness, swelling, muscle weakness and myalgia.  Dermatologic: Denied dermatology symptoms, rash and scar.  Neurologic: Denied neurology symptoms, dizziness, headache, neck pain and  syncope.  Psychiatric: Denied psychiatric symptoms, anxiety and depression.  Endocrine: Denied endocrine symptoms including hot flashes and night sweats.   Meds:   No current outpatient medications on file prior to visit.   No current facility-administered medications on file prior to visit.    Objective:     Vitals:   06/07/19 0827  BP: 111/70  Pulse: 81              Colposcopy results discussed directly with the patient.  CIN-1  Assessment:    [id number] Patient Active Problem List   Diagnosis Date Noted  . Heavy periods 07/18/2017  . Abnormal breast finding 03/10/2017  . ASCUS with positive high risk HPV cervical 08/04/2016  . Cervical cancer screening 08/02/2016  . Irregular periods 10/30/2015  . Fatigue 10/30/2015     1. CIN I (cervical intraepithelial neoplasia I)     This is consistent with her Pap smear findings   Plan:            1.  We have discussed the natural course and history of HPV and its relationship to cervical dysplasia.  Cervical dysplasia itself was discussed in detail.  I have recommended a follow-up colposcopy in 1 year.  2.  She does not want to wait until Sharyn Lull comes back in February to discuss work-up and cycle regulation.  I have agreed to see her next week to create a plan and begin management of irregular menstrual bleeding.  Plan to transfer her back to [facility] when she returns. Orders No orders of the defined types were placed in this encounter.   No orders of the defined types were placed in this encounter.     F/U  Return in about 1 week (around 06/14/2019). I spent 21 minutes involved in the care of this patient of which greater than 50% was spent discussing colposcopy findings and relationship to Pap smear, future follow-ups, irregular bleeding as discussed above.  All questions answered.  Finis Bud, M.D. 06/07/2019 8:57 AM

## 2019-06-07 NOTE — Progress Notes (Signed)
Pt present for colpo follow up and results. Pt stated that she has been doing well other than having irregular cycles. Pt's LMP 05/26/19. Pt stated that she is still on her cycle. Pt's LMP before then was 05/14/19.

## 2019-06-14 ENCOUNTER — Telehealth: Payer: BC Managed Care – PPO | Admitting: Emergency Medicine

## 2019-06-14 ENCOUNTER — Encounter: Payer: BC Managed Care – PPO | Admitting: Obstetrics and Gynecology

## 2019-06-14 DIAGNOSIS — B349 Viral infection, unspecified: Secondary | ICD-10-CM

## 2019-06-14 DIAGNOSIS — Z7189 Other specified counseling: Secondary | ICD-10-CM

## 2019-06-14 DIAGNOSIS — Z20822 Contact with and (suspected) exposure to covid-19: Secondary | ICD-10-CM

## 2019-06-14 NOTE — Progress Notes (Signed)
Time spent: 5 min  E-Visit for [facility] Virus Screening  [name],   [place] sorry you are not feeling well.    Your current symptoms could be consistent with the coronavirus.  Your symptoms could also be from another virus like influenza, rhinovirus, etc.  I recommend formal testing to rule out COVID-19 infection.    Many health care providers can now test patients at their office but not all are.  [facility] has multiple testing sites. For information on our [place] testing locations and hours go to [email or web address]  We are enrolling you in our [place] for [place] . Daily you will receive a questionnaire within the [facility] website. Our COVID 19 response team will be monitoring your responses daily.  Testing Information: The COVID-19 Community Testing sites will begin testing BY APPOINTMENT ONLY.  You can schedule online at [email or web address]  If you do not have access to a smart phone or computer you may call [phone] for an appointment.  Testing Locations: Appointment schedule is 8 am to 3:30 pm at all sites  [facility] indoors at [address], [place] [address] [facility]  indoors at [place]. [address] [place], [place] [address] [place] indoors at [address], [place] [address]  Additional testing sites in the Community:  . For [facility] Testing sites in [facility]  FaceUpdate.uy  . For Pop-up testing sites in [place]  BowlDirectory.co.uk  . For Testing sites with regular hours [email or web address]  . For [place] [email or web address]  . For [facility] [email or web address]  . For [facility] testing in [place] and  [employer] [email or web address]  . For [facility] testing in [facility]   [email or web address].[email or web address]  For  more information about community testing call [phone]   We are enrolling you in our [place] for [place] . Daily you will receive a questionnaire within the [place] website. Our COVID 19 response team will be monitoring your responses daily.  Please quarantine yourself while awaiting your test results. Please stay home for a total of 10 days after onset of symptoms.  You may stop isolation as long as you have improving symptoms and you have had 24 hours of no fever 100.4 F (without taking a fever reducer).  You should wear a mask or cloth face covering over your nose and mouth if you must be around other people or animals, including pets (even at home). Try to stay at least 6 feet away from other people. This will protect the people around you.  Please continue good preventive care measures, including:  frequent hand-washing, avoid touching your face, cover coughs/sneezes, stay out of crowds and keep a 6 foot distance from others.  COVID-19 is a respiratory illness with symptoms that are similar to the flu. Symptoms are typically mild to moderate, but there have been cases of severe illness and death due to the virus.   The following symptoms may appear 2-14 days after exposure: . Fever . Cough . Shortness of breath or difficulty breathing . Chills . Repeated shaking with chills . Muscle pain . Headache . Sore throat . New loss of taste or smell . Fatigue . Congestion or runny nose . Nausea or vomiting . Diarrhea  Go to the nearest hospital ED for assessment if fever/cough/breathlessness are severe or illness seems like a threat to life.  It is vitally important that if you feel that you have an infection such as this virus  or any other virus that you stay home and  away from places where you may spread it to others.  You should avoid contact with people age 60 and older.   The main treatment approach for a viral illness is to treat the symptoms, support your immune system and prevent spread of illness.    Stay well-hydrated. Rest. You can use over the counter medications to help with symptoms: ? 600 mg ibuprofen (motrin, aleve, advil) or acetaminophen (tylenol) every 6 hours, around the clock to help with associated fevers, sore throat, headaches, generalized body aches and malaise.  ? Oxymetazoline (afrin) intranasal spray once daily for no more than 3 days to help with congestion, after 3 days you can switch to another over-the-counter nasal steroid spray such as fluticasone (flonase) ? Allergy medication (loratadine, cetirizine, etc) and phenylephrine (sudafed) help with nasal congestion, runny nose and postnasal drip.   ? Dextromethorphan (Delsym) to suppress dry cough  ? Guaifenesin (mucinex) to help with built up mucus in chest and productive cough ? Wash your hands often to prevent spread.  ? Ondansatron (Zofran) for nausea    A viral upper respiratory infection can also worsen and progress into pneumonia.  Monitor your symptoms. Return for persistent fevers, chest pain, productive cough, inability to tolerate fluids despite nausea medicines or dehydration    Reduce your risk of any infection by using the same precautions used for avoiding the common cold or flu:  Marland Kitchen Wash your hands often with soap and warm water for at least 20 seconds.  If soap and water are not readily available, use an alcohol-based hand sanitizer with at least 60% alcohol.  . If coughing or sneezing, cover your mouth and nose by coughing or sneezing into the elbow areas of your shirt or coat, into a tissue or into your sleeve (not your hands). . Avoid shaking hands with others and consider head nods or verbal greetings only. . Avoid touching your eyes, nose, or mouth with  unwashed hands.  . Avoid close contact with people who are sick. . Avoid places or events with large numbers of people in one location, like concerts or sporting events. . Carefully consider travel plans you have or are making. . If you are planning any travel outside or inside the [place], visit the CDC's Travelers' Health webpage for the latest health notices. . If you have some symptoms but not all symptoms, continue to monitor at home and seek medical attention if your symptoms worsen. . If you are having a medical emergency, call 911.  HOME CARE . Only take medications as instructed by your medical team. . Drink plenty of fluids and get plenty of rest. . A steam or ultrasonic humidifier can help if you have congestion.   GET HELP RIGHT AWAY IF YOU HAVE EMERGENCY WARNING SIGNS** FOR COVID-19. If you or someone is showing any of these signs seek emergency medical care immediately. Call 911 or proceed to your closest emergency facility if: . You develop worsening high fever. . Trouble breathing . Bluish lips or face . Persistent pain or pressure in the chest . New confusion . Inability to wake or stay awake . You cough up blood. . Your symptoms become more severe  **This list is not all possible symptoms. Contact your medical provider for any symptoms that are sever or concerning to you.  MAKE SURE YOU   Understand these instructions.  Will watch your condition.  Will get help right away if you are  not doing well or get worse.  Your e-visit answers were reviewed by a board certified advanced clinical practitioner to complete your personal care plan.  Depending on the condition, your plan could have included both over the counter or prescription medications.  If there is a problem please reply once you have received a response from your provider.  Your safety is important to [place].  If you have drug allergies check your prescription carefully.    You can use MyChart to ask questions  about today's visit, request a non-urgent call back, or ask for a work or school excuse for 24 hours related to this e-Visit. If it has been greater than 24 hours you will need to follow up with your provider, or enter a new e-Visit to address those concerns. You will get an e-mail in the next two days asking about your experience.  I hope that your e-visit has been valuable and will speed your recovery. Thank you for using e-visits.   I hope you feel better,   Carmon Sails, PA-C [place]

## 2019-07-03 ENCOUNTER — Ambulatory Visit
Admission: RE | Admit: 2019-07-03 | Discharge: 2019-07-03 | Disposition: A | Discharge status: Home / Self Care | Payer: BC Managed Care – PPO | Source: Ambulatory Visit | Attending: Family Medicine | Admitting: Family Medicine

## 2019-07-03 DIAGNOSIS — N6320 Unspecified lump in the left breast, unspecified quadrant: Secondary | ICD-10-CM

## 2019-07-03 DIAGNOSIS — R922 Inconclusive mammogram: Secondary | ICD-10-CM | POA: Diagnosis not present

## 2019-07-03 DIAGNOSIS — N6012 Diffuse cystic mastopathy of left breast: Secondary | ICD-10-CM | POA: Diagnosis not present

## 2019-10-09 ENCOUNTER — Telehealth (INDEPENDENT_AMBULATORY_CARE_PROVIDER_SITE_OTHER): Payer: BC Managed Care – PPO | Admitting: Family Medicine

## 2019-10-09 ENCOUNTER — Encounter: Payer: Self-pay | Admitting: Family Medicine

## 2019-10-09 ENCOUNTER — Other Ambulatory Visit: Payer: Self-pay

## 2019-10-09 DIAGNOSIS — M25572 Pain in left ankle and joints of left foot: Secondary | ICD-10-CM | POA: Diagnosis not present

## 2019-10-09 DIAGNOSIS — M79672 Pain in left foot: Secondary | ICD-10-CM | POA: Diagnosis not present

## 2019-10-09 MED ORDER — MELOXICAM 7.5 MG PO TABS: 7.5000 mg | ORAL_TABLET | Freq: Every day | ORAL | 1 refills | Status: DC

## 2019-10-09 MED ORDER — PREDNISONE 20 MG PO TABS: ORAL_TABLET | ORAL | 0 refills | Status: DC

## 2019-10-09 NOTE — Progress Notes (Signed)
Name: Dana Wolf   MRN: [id number]    DOB: 23-Jun-1970   Date:10/09/2019       Progress Note  Subjective:    Chief Complaint  Chief Complaint  Patient presents with  . Ankle Pain    left    I connected with  Janecia L Bostick  on 10/09/19 at  2:00 PM EDT by a video enabled telemedicine application and verified that I am speaking with the correct person using two identifiers.  I discussed the limitations of evaluation and management by telemedicine and the availability of in person appointments. The patient expressed understanding and agreed to proceed. Staff also discussed with the patient that there may be a patient responsible charge related to this service. 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   Started hurting one day without injury She is having aching pain located to inner ankle with sharp pains that shoots through her foot Pain is hurting severe after 30 min on her feet She has supportive shoes, she works with walking all day every day Does not appear swollen or red She has pain to medial and lateral malleolus, and to bottom of foot.  No pain to the top of her foot.   No stiffness or increased pain in the morning or evening  No hx of old injury No hx of gout  She has been taking Ibuprofen 400 mg PRN without any improvement    Patient Active Problem List   Diagnosis Date Noted  . Heavy periods 07/18/2017  . Abnormal breast finding 03/10/2017  . ASCUS with positive high risk HPV cervical 08/04/2016  . Cervical cancer screening 08/02/2016  . Irregular periods 10/30/2015  . Fatigue 10/30/2015    Social History   Tobacco Use  . Smoking status: Never Smoker  . Smokeless tobacco: Never Used  Substance Use Topics  . Alcohol use: No    Alcohol/week: 0.0 standard drinks     Current Outpatient Medications:  .  meloxicam (MOBIC) 7.5 MG tablet, Take 1 tablet (7.5 mg  total) by mouth daily., Disp: 30 tablet, Rfl: 1 .  predniSONE (DELTASONE) 20 MG tablet, 2 tabs poqday 1-3, 1 tabs poqday 4-6, Disp: 9 tablet, Rfl: 0  No Known Allergies  I personally reviewed active problem list, medication list, allergies, family history, social history, health maintenance, notes from last encounter, lab results, imaging with the patient/caregiver today.   Review of Systems  10 Systems reviewed and are negative for acute change except as noted in the HPI.   Objective:   Virtual encounter, vitals limited, only able to obtain the following There were no vitals filed for this visit. There is no height or weight on file to calculate BMI. Nursing Note and Vital Signs reviewed.  Physical Exam Vitals and nursing note reviewed.  Constitutional:      General: She is not in acute distress.    Appearance: Normal appearance. She is well-developed. She is not ill-appearing, toxic-appearing or diaphoretic.  HENT:     Head: Normocephalic and atraumatic.  Neck:     Trachea: No tracheal deviation.  Pulmonary:     Effort: Pulmonary effort is normal. No respiratory distress.     Breath sounds: No stridor.  Neurological:     Mental Status: She is alert.  Psychiatric:        Mood and Affect: Mood normal.        Behavior: Behavior  normal.   pt is not able to show me her foot or ankle today, she did walk around a little during virtual encounter and did not seem to have a limp  PE limited by telephone encounter  No results found for this or any previous visit (from the past 72 hour(s)).  Assessment and Plan:     ICD-10-CM   1. Acute left ankle pain  M25.572 meloxicam (MOBIC) 7.5 MG tablet  2. Left foot pain  M79.672 meloxicam (MOBIC) 7.5 MG tablet     D/C ibuprofen, try mobic, tylenol, rest, elevation, can try ice W/o injury may be arthritis?  Try tx with few days of steroids since pt endorses severe pain. Also in ddx is gout, though no hx and no swelling or redness.   Possible heel or plantar fascia involvement? Since my assessment is limited and likely plain films will not change plan very much w/o injury or suspected fx vs sprain, will refer to foot and ankle specialists for eval.  Defer imaging to them    -Red flags and when to present for emergency care or RTC including fever >101.68F, chest pain, shortness of breath, new/worsening/un-resolving symptoms, reviewed with patient at time of visit. Follow up and care instructions discussed and provided in AVS. - I discussed the assessment and treatment plan with the patient. The patient was provided an opportunity to ask questions and all were answered. The patient agreed with the plan and demonstrated an understanding of the instructions.  I provided 25+ minutes of non-face-to-face time during this encounter.  Delsa Grana, PA-C 10/09/19 4:34 PM

## 2019-10-23 ENCOUNTER — Ambulatory Visit (INDEPENDENT_AMBULATORY_CARE_PROVIDER_SITE_OTHER): Payer: BC Managed Care – PPO | Admitting: Podiatry

## 2019-10-23 ENCOUNTER — Encounter: Payer: Self-pay | Admitting: *Deleted

## 2019-10-23 ENCOUNTER — Other Ambulatory Visit: Payer: Self-pay

## 2019-10-23 ENCOUNTER — Ambulatory Visit (INDEPENDENT_AMBULATORY_CARE_PROVIDER_SITE_OTHER): Payer: BC Managed Care – PPO

## 2019-10-23 DIAGNOSIS — M65172 Other infective (teno)synovitis, left ankle and foot: Secondary | ICD-10-CM

## 2019-10-23 DIAGNOSIS — M659 Synovitis and tenosynovitis, unspecified: Secondary | ICD-10-CM

## 2019-10-24 ENCOUNTER — Encounter: Payer: Self-pay | Admitting: Podiatry

## 2019-10-26 NOTE — Progress Notes (Signed)
   Subjective:  49 y.o. female presenting today as a new patient with a chief complaint of sharp, throbbing pain to the left ankle that began three weeks ago. She reports associated swelling. Walking on concrete increases the pain. She has been taking Meloxicam and Prednisone prescribed by her PCP. She stopped taking the medication about one week ago. Patient is here for further evaluation and treatment.   Past Medical History:  Diagnosis Date  . ASCUS with positive high risk HPV cervical 08/04/2016   Refer to GYN Feb 2018     Objective / Physical Exam:  General:  The patient is alert and oriented x3 in no acute distress. Dermatology:  Skin is warm, dry and supple bilateral lower extremities. Negative for open lesions or macerations. Vascular:  Palpable pedal pulses bilaterally. No edema or erythema noted. Capillary refill within normal limits. Neurological:  Epicritic and protective threshold grossly intact bilaterally.  Musculoskeletal Exam:  Pain on palpation to the anterior lateral medial aspects of the patient's left ankle. Mild edema noted. Range of motion within normal limits to all pedal and ankle joints bilateral. Muscle strength 5/5 in all groups bilateral.   Radiographic Exam:  Normal osseous mineralization. Joint spaces preserved. No fracture/dislocation/boney destruction.    Assessment: 1. Left ankle synovitis   Plan of Care:  1. Patient was evaluated. X-Rays reviewed.  2. Injection of 0.5 mL Celestone Soluspan injected in the patient's left ankle. 3. Resume taking Meloxicam daily.  4. CAM boot dispensed.  5. Ace wraps dispensed.  6. Return to clinic in 3 weeks.   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   Edrick Kins, DPM [facility]  Dr. Edrick Kins, [place]                                        [place], [place] [address]                Office [phone]  Fax [phone]

## 2019-10-29 ENCOUNTER — Encounter: Payer: Self-pay | Admitting: Family Medicine

## 2019-10-31 ENCOUNTER — Telehealth: Payer: Self-pay | Admitting: *Deleted

## 2019-10-31 DIAGNOSIS — M79676 Pain in unspecified toe(s): Secondary | ICD-10-CM

## 2019-10-31 NOTE — Telephone Encounter (Signed)
I faxed the disability forms to [facility] along with her clinical notes.  I am calling to let you know I have completed your FMLA forms.  I faxed the [employer] forms to [place].  The [place] [employer]  Forms has a statement to return to the patient.  "Okay, I'll come over there to get it tomorrow and pay for it." I'll leave it at the front desk with [place].  Estill Bamberg can you put in charges for Ms. Ellery, $25 initial and $10 for an additional form?)

## 2019-11-16 ENCOUNTER — Encounter: Payer: Self-pay | Admitting: *Deleted

## 2019-11-16 ENCOUNTER — Encounter (INDEPENDENT_AMBULATORY_CARE_PROVIDER_SITE_OTHER): Payer: BC Managed Care – PPO | Admitting: Podiatry

## 2019-11-16 ENCOUNTER — Encounter: Payer: Self-pay | Admitting: Podiatry

## 2019-11-16 ENCOUNTER — Other Ambulatory Visit: Payer: Self-pay

## 2019-11-16 DIAGNOSIS — M659 Synovitis and tenosynovitis, unspecified: Secondary | ICD-10-CM

## 2019-11-19 NOTE — Progress Notes (Addendum)
   Subjective:  49 y.o. female presenting today for follow-up evaluation regarding left ankle pain.  Patient continues to feel throbbing and discomfort.  She states that she tried to go a day without the cam boot and by the end of the day she was feeling throbbing pain.  The anti-inflammatory injection only helped temporarily.  She is continuing to take the meloxicam.  No new complaints at this time   Past Medical History:  Diagnosis Date  . ASCUS with positive high risk HPV cervical 08/04/2016   Refer to GYN Feb 2018     Objective / Physical Exam:  General:  The patient is alert and oriented x3 in no acute distress. Dermatology:  Skin is warm, dry and supple bilateral lower extremities. Negative for open lesions or macerations. Vascular:  Palpable pedal pulses bilaterally. No edema or erythema noted. Capillary refill within normal limits. Neurological:  Epicritic and protective threshold grossly intact bilaterally.  Musculoskeletal Exam:  Pain on palpation to the anterior lateral medial aspects of the patient's left ankle. Mild edema noted. Range of motion within normal limits to all pedal and ankle joints bilateral. Muscle strength 5/5 in all groups bilateral.    Assessment: 1. Left ankle synovitis   Plan of Care:  1. Patient was evaluated. X-Rays reviewed.  2. Injection of 0.5 mL Celestone Soluspan injected in the patient's left ankle. 3.  Continue meloxicam daily 4.  Today we will order physical therapy at [facility] 5.  Note for work was provided today.  No work x4 weeks.  Patient will apply for short-term disability 6.  Continue wearing the cam boot.  Weightbearing as tolerated 7.  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   Edrick Kins, DPM [facility]  Dr. Edrick Kins, [address]                                        [place], [place] [address]                Office [phone]  Fax [phone]

## 2019-11-26 NOTE — Addendum Note (Signed)
Addended by: Edrick Kins on: 11/26/2019 08:25 AM   Modules accepted: Level of Service

## 2019-11-29 DIAGNOSIS — M79676 Pain in unspecified toe(s): Secondary | ICD-10-CM

## 2019-12-03 DIAGNOSIS — M25572 Pain in left ankle and joints of left foot: Secondary | ICD-10-CM | POA: Diagnosis not present

## 2019-12-03 DIAGNOSIS — R2689 Other abnormalities of gait and mobility: Secondary | ICD-10-CM | POA: Diagnosis not present

## 2019-12-11 DIAGNOSIS — R2689 Other abnormalities of gait and mobility: Secondary | ICD-10-CM | POA: Diagnosis not present

## 2019-12-11 DIAGNOSIS — M25572 Pain in left ankle and joints of left foot: Secondary | ICD-10-CM | POA: Diagnosis not present

## 2019-12-14 DIAGNOSIS — M79676 Pain in unspecified toe(s): Secondary | ICD-10-CM

## 2019-12-18 ENCOUNTER — Encounter: Payer: Self-pay | Admitting: Podiatry

## 2019-12-18 ENCOUNTER — Ambulatory Visit (INDEPENDENT_AMBULATORY_CARE_PROVIDER_SITE_OTHER): Payer: BC Managed Care – PPO | Admitting: Podiatry

## 2019-12-18 ENCOUNTER — Other Ambulatory Visit: Payer: Self-pay

## 2019-12-18 DIAGNOSIS — M659 Synovitis and tenosynovitis, unspecified: Secondary | ICD-10-CM

## 2019-12-18 MED ORDER — MELOXICAM 15 MG PO TABS: 15.0000 mg | ORAL_TABLET | Freq: Every day | ORAL | 1 refills | Status: DC

## 2019-12-18 NOTE — Progress Notes (Signed)
   Subjective:  49 y.o. female presenting today for follow-up evaluation of left ankle pain.  Patient states that she is doing much better.  She has been going to physical therapy and has 4 sessions left.  She is also been wearing the compression ankle sleeve and taking meloxicam as needed.  No new complaints at this time  Past Medical History:  Diagnosis Date  . ASCUS with positive high risk HPV cervical 08/04/2016   Refer to GYN Feb 2018     Objective / Physical Exam:  General:  The patient is alert and oriented x3 in no acute distress. Dermatology:  Skin is warm, dry and supple bilateral lower extremities. Negative for open lesions or macerations. Vascular:  Palpable pedal pulses bilaterally. No edema or erythema noted. Capillary refill within normal limits. Neurological:  Epicritic and protective threshold grossly intact bilaterally.  Musculoskeletal Exam:  Pain on palpation to the lateral aspect of the patient's left ankle.  Negative for any significant edema noted. Range of motion within normal limits to all pedal and ankle joints bilateral. Muscle strength 5/5 in all groups bilateral.   Assessment: 1. Left ankle synovitis   Plan of Care:  1. Patient was evaluated. X-Rays reviewed.  2. Injection of 0.5 mL Celestone Soluspan injected in the patient's left ankle. 3.  Continue meloxicam daily as needed 4.  Discontinue cam boot.  Continue physical therapy until completed.  Patient states she has 4 sessions left 5.  Today we will plan for the patient to return to work light duty with restrictions beginning 01/07/2020 through 02/07/2020.  Beginning 02/08/2020 she may resume full activity no restrictions 6.  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   Edrick Kins, DPM [facility]  Dr. Edrick Kins, [address]                                        [place], [place] [address]                Office [phone]    Fax [phone]

## 2019-12-19 DIAGNOSIS — R2689 Other abnormalities of gait and mobility: Secondary | ICD-10-CM | POA: Diagnosis not present

## 2019-12-19 DIAGNOSIS — M25572 Pain in left ankle and joints of left foot: Secondary | ICD-10-CM | POA: Diagnosis not present

## 2019-12-21 DIAGNOSIS — R2689 Other abnormalities of gait and mobility: Secondary | ICD-10-CM | POA: Diagnosis not present

## 2019-12-21 DIAGNOSIS — M25572 Pain in left ankle and joints of left foot: Secondary | ICD-10-CM | POA: Diagnosis not present

## 2019-12-27 DIAGNOSIS — M79676 Pain in unspecified toe(s): Secondary | ICD-10-CM

## 2020-02-01 DIAGNOSIS — M79676 Pain in unspecified toe(s): Secondary | ICD-10-CM

## 2020-04-25 ENCOUNTER — Other Ambulatory Visit (HOSPITAL_COMMUNITY)
Admission: RE | Admit: 2020-04-25 | Discharge: 2020-04-25 | Disposition: A | Discharge status: Home / Self Care | Payer: BC Managed Care – PPO | Source: Ambulatory Visit | Attending: Certified Nurse Midwife | Admitting: Certified Nurse Midwife

## 2020-04-25 ENCOUNTER — Encounter: Payer: Self-pay | Admitting: Certified Nurse Midwife

## 2020-04-25 ENCOUNTER — Ambulatory Visit (INDEPENDENT_AMBULATORY_CARE_PROVIDER_SITE_OTHER): Payer: BC Managed Care – PPO | Admitting: Certified Nurse Midwife

## 2020-04-25 ENCOUNTER — Other Ambulatory Visit: Payer: Self-pay

## 2020-04-25 VITALS — BP 117/72 | HR 79 | Ht 64.0 in | Wt 163.4 lb

## 2020-04-25 DIAGNOSIS — N870 Mild cervical dysplasia: Secondary | ICD-10-CM | POA: Diagnosis not present

## 2020-04-25 DIAGNOSIS — Z23 Encounter for immunization: Secondary | ICD-10-CM | POA: Diagnosis not present

## 2020-04-25 DIAGNOSIS — Z124 Encounter for screening for malignant neoplasm of cervix: Secondary | ICD-10-CM

## 2020-04-25 DIAGNOSIS — R7989 Other specified abnormal findings of blood chemistry: Secondary | ICD-10-CM | POA: Diagnosis not present

## 2020-04-25 DIAGNOSIS — Z8742 Personal history of other diseases of the female genital tract: Secondary | ICD-10-CM | POA: Diagnosis not present

## 2020-04-25 DIAGNOSIS — B977 Papillomavirus as the cause of diseases classified elsewhere: Secondary | ICD-10-CM | POA: Insufficient documentation

## 2020-04-25 DIAGNOSIS — N72 Inflammatory disease of cervix uteri: Secondary | ICD-10-CM

## 2020-04-25 DIAGNOSIS — Z01419 Encounter for gynecological examination (general) (routine) without abnormal findings: Secondary | ICD-10-CM | POA: Insufficient documentation

## 2020-04-25 DIAGNOSIS — Z1231 Encounter for screening mammogram for malignant neoplasm of breast: Secondary | ICD-10-CM

## 2020-04-25 DIAGNOSIS — R87612 Low grade squamous intraepithelial lesion on cytologic smear of cervix (LGSIL): Secondary | ICD-10-CM | POA: Insufficient documentation

## 2020-04-25 DIAGNOSIS — N926 Irregular menstruation, unspecified: Secondary | ICD-10-CM

## 2020-04-25 DIAGNOSIS — D251 Intramural leiomyoma of uterus: Secondary | ICD-10-CM | POA: Diagnosis not present

## 2020-04-25 NOTE — Progress Notes (Signed)
ANNUAL PREVENTATIVE CARE GYN  ENCOUNTER NOTE  Subjective:       Dana Wolf is a 49 y.o. [phone] female here for a routine annual gynecologic exam.  Current complaints: 1. Irregular menses 2. Fibroid uterus  Denies difficulty breathing or respiratory distress, chest pain, abdominal pain, excessive vaginal bleeding, dysuria, and leg pain or swelling.     Gynecologic History  Patient's last menstrual period was 04/03/2020 (exact date). Period Pattern: (!) Irregular Menstrual Flow: Light, Heavy  Contraception: tubal ligation  Last Pap: 04/2019. Results were: abnormal, LSIL, CIN I on colposcopy  Last mammogram: 06/2019. Results were: BI-RADS 2  Obstetric History  OB History  Gravida Para Term Preterm AB Living  3 3 2 1   3   SAB TAB Ectopic Multiple Live Births          3    # Outcome Date GA Lbr Len/2nd Weight Sex Delivery Anes PTL Lv  3 Term 02/28/98   6 lb 1 oz (2.75 kg) F Vag-Spont None  LIV  2 Preterm 10/29/95 [redacted]w[redacted]d  5 lb 5 oz (2.41 kg) M Vag-Spont None  LIV     Complications: Ruptured, membranes, premature  1 Term 02/16/92   7 lb 1 oz (3.204 kg) F Vag-Spont None  LIV    Past Medical History:  Diagnosis Date  . 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 COLPOSCOPY    . TUBAL LIGATION      Current Outpatient Medications on File Prior to Visit  Medication Sig Dispense Refill  . meloxicam (MOBIC) 15 MG tablet Take 1 tablet (15 mg total) by mouth daily. 30 tablet 1   No current facility-administered medications on file prior to visit.    No Known Allergies  Social History   Socioeconomic History  . Marital status: Married    Spouse name: Not on file  . Number of children: Not on file  . Years of education: Not on file  . Highest education level: Not on file  Occupational History  . Not on file  Tobacco Use  . Smoking status: Never Smoker  . Smokeless tobacco:  Never Used  Vaping Use  . Vaping Use: Never used  Substance and Sexual Activity  . Alcohol use: No    Alcohol/week: 0.0 standard drinks  . Drug use: No  . Sexual activity: Yes    Birth control/protection: Surgical    Comment: tubal ligation   Other Topics Concern  . Not on file  Social History Narrative  . Not on file   Social Determinants of Health   Financial Resource Strain:   . Difficulty of Paying Living Expenses: Not on file  Food Insecurity:   . Worried About Charity fundraiser in the Last Year: Not on file  . Ran Out of Food in the Last Year: Not on file  Transportation Needs:   . Lack of Transportation (Medical): Not on file  . Lack of Transportation (Non-Medical): Not on file  Physical Activity:   . Days of Exercise per Week: Not on file  . Minutes of Exercise per Session: Not on file  Stress:   . Feeling of Stress : Not on file  Social Connections:   . Frequency of Communication with Friends and Family: Not on file  . Frequency of Social Gatherings with Friends and Family: Not on file  . Attends Religious Services: Not  on file  . Active Member of Clubs or Organizations: Not on file  . Attends Archivist Meetings: Not on file  . Marital Status: Not on file  Intimate Partner Violence:   . Fear of Current or Ex-Partner: Not on file  . Emotionally Abused: Not on file  . Physically Abused: Not on file  . Sexually Abused: Not on file    Family History  Adopted: Yes  Problem Relation Age of Onset  . Breast cancer Neg Hx   . Ovarian cancer Neg Hx   . Colon cancer Neg Hx     The following portions of the patient's history were reviewed and updated as appropriate: allergies, current medications, past family history, past medical history, past social history, past surgical history and problem list.  Review of Systems  ROS negative except as noted above. Information obtained from patient.    Objective:   BP 117/72   Pulse 79   Ht 5\' 4"  (1.626 m)    Wt 163 lb 7 oz (74.1 kg)   LMP 04/03/2020 (Exact Date)   BMI 28.05 kg/m    CONSTITUTIONAL: Well-developed, well-nourished female in no acute distress.   PSYCHIATRIC: Normal mood and affect. Normal behavior. Normal judgment and thought content.  [place]: Alert and oriented to person, place, and time. Normal muscle  tone coordination. No cranial nerve deficit noted.  HENT:  Normocephalic, atraumatic, External right and left ear normal.  EYES: Conjunctivae and EOM are normal. Pupils are equal and round.   NECK: Normal range of motion, supple, no masses.  Normal thyroid.   SKIN: Skin is warm and dry. No rash noted. Not diaphoretic. No erythema. No pallor. Professional tattoos present.   CARDIOVASCULAR: Normal heart rate noted, regular rhythm, no murmur.  RESPIRATORY: Clear to auscultation bilaterally. Effort and breath sounds normal, no problems with respiration noted.  BREASTS: Symmetric in size. No masses, skin changes, nipple drainage, or lymphadenopathy.  ABDOMEN: Soft, normal bowel sounds, no distention noted.  No tenderness, rebound or guarding.   PELVIC:  External Genitalia: Normal  Vagina: Normal  Cervix: Normal, Pap collected  Uterus: Normal  Adnexa: Normal  MUSCULOSKELETAL: Normal range of motion. No tenderness.  No cyanosis, clubbing, or edema.  2+ distal pulses.  LYMPHATIC: No Axillary, Supraclavicular, or Inguinal Adenopathy.  Assessment:   Annual gynecologic examination 49 y.o.   Contraception: tubal ligation   Overweight   Problem List Items Addressed This Visit    None    Visit Diagnoses    Well woman exam    -  Primary   Relevant Orders   MM 3D SCREEN BREAST BILATERAL   Estradiol   Cytology - PAP   US PELVIS TRANSVAGINAL NON-OB (TV ONLY)   US PELVIS (TRANSABDOMINAL ONLY)   Need for immunization against influenza       Screening for cervical cancer       Relevant Orders   Cytology - PAP   CIN I (cervical intraepithelial neoplasia I)        Relevant Orders   Cytology - PAP   Low grade squamous intraepithelial lesion on cytologic smear of cervix (LGSIL)       Relevant Orders   Cytology - PAP   Fibroids, intramural       Relevant Orders   US PELVIS TRANSVAGINAL NON-OB (TV ONLY)   US PELVIS (TRANSABDOMINAL ONLY)   High risk human papilloma virus (HPV) infection of cervix       Relevant Orders   Cytology - PAP  History of ovarian cyst       Relevant Orders   Estradiol   US PELVIS TRANSVAGINAL NON-OB (TV ONLY)   US PELVIS (TRANSABDOMINAL ONLY)   Screening mammogram for breast cancer       Relevant Orders   MM 3D SCREEN BREAST BILATERAL   High serum estradiol       Relevant Orders   Estradiol   US PELVIS TRANSVAGINAL NON-OB (TV ONLY)   US PELVIS (TRANSABDOMINAL ONLY)   Irregular menses       Relevant Orders   US PELVIS TRANSVAGINAL NON-OB (TV ONLY)   US PELVIS (TRANSABDOMINAL ONLY)      Plan:   Pap: Pap Co Test  Mammogram: Ordered  Labs: See orders   Routine preventative health maintenance measures emphasized: Exercise/Diet/Weight control, Tobacco Warnings, Alcohol/Substance use risks and Stress Management; see AVS  Reviewed red flag symptoms and when to call  RTC x 2 weeks for ultrasound and results review  Return to Clinic - 1 Year for [employer] or sooner if needed   Dani Gobble, CNM  [facility], [facility] 04/25/20 11:29 AM

## 2020-04-25 NOTE — Patient Instructions (Signed)
Preventive Care 56-47 Years Old, Female Preventive care refers to visits with your health care provider and lifestyle choices that can promote health and wellness. This includes:  A yearly physical exam. This may also be called an annual well check.  Regular dental visits and eye exams.  Immunizations.  Screening for certain conditions.  Healthy lifestyle choices, such as eating a healthy diet, getting regular exercise, not using drugs or products that contain nicotine and tobacco, and limiting alcohol use. What can I expect for my preventive care visit? Physical exam Your health care provider will check your:  Height and weight. This may be used to calculate body mass index (BMI), which tells if you are at a healthy weight.  Heart rate and blood pressure.  Skin for abnormal spots. Counseling Your health care provider may ask you questions about your:  Alcohol, tobacco, and drug use.  Emotional well-being.  Home and relationship well-being.  Sexual activity.  Eating habits.  Work and work Statistician.  Method of birth control.  Menstrual cycle.  Pregnancy history. What immunizations do I need?  Influenza (flu) vaccine  This is recommended every year. Tetanus, diphtheria, and pertussis (Tdap) vaccine  You may need a Td booster every 10 years. Varicella (chickenpox) vaccine  You may need this if you have not been vaccinated. Zoster (shingles) vaccine  You may need this after age 46. Measles, mumps, and rubella (MMR) vaccine  You may need at least one dose of MMR if you were born in 1957 or later. You may also need a second dose. Pneumococcal conjugate (PCV13) vaccine  You may need this if you have certain conditions and were not previously vaccinated. Pneumococcal polysaccharide (PPSV23) vaccine  You may need one or two doses if you smoke cigarettes or if you have certain conditions. Meningococcal conjugate (MenACWY) vaccine  You may need this if  you have certain conditions. Hepatitis A vaccine  You may need this if you have certain conditions or if you travel or work in places where you may be exposed to hepatitis A. Hepatitis B vaccine  You may need this if you have certain conditions or if you travel or work in places where you may be exposed to hepatitis B. Haemophilus influenzae type b (Hib) vaccine  You may need this if you have certain conditions. Human papillomavirus (HPV) vaccine  If recommended by your health care provider, you may need three doses over 6 months. You may receive vaccines as individual doses or as more than one vaccine together in one shot (combination vaccines). Talk with your health care provider about the risks and benefits of combination vaccines. What tests do I need? Blood tests  Lipid and cholesterol levels. These may be checked every 5 years, or more frequently if you are over 62 years old.  Hepatitis C test.  Hepatitis B test. Screening  Lung cancer screening. You may have this screening every year starting at age 8 if you have a 30-pack-year history of smoking and currently smoke or have quit within the past 15 years.  Colorectal cancer screening. All adults should have this screening starting at age 30 and continuing until age 97. Your health care provider may recommend screening at age 66 if you are at increased risk. You will have tests every 1-10 years, depending on your results and the type of screening test.  Diabetes screening. This is done by checking your blood sugar (glucose) after you have not eaten for a while (fasting). You may have  this done every 1-3 years.  Mammogram. This may be done every 1-2 years. Talk with your health care provider about when you should start having regular mammograms. This may depend on whether you have a family history of breast cancer.  BRCA-related cancer screening. This may be done if you have a family history of breast, ovarian, tubal, or  peritoneal cancers.  Pelvic exam and Pap test. This may be done every 3 years starting at age 21. Starting at age 30, this may be done every 5 years if you have a Pap test in combination with an HPV test. Other tests  Sexually transmitted disease (STD) testing.  Bone density scan. This is done to screen for osteoporosis. You may have this scan if you are at high risk for osteoporosis. Follow these instructions at home: Eating and drinking  Eat a diet that includes fresh fruits and vegetables, whole grains, lean protein, and low-fat dairy.  Take vitamin and mineral supplements as recommended by your health care provider.  Do not drink alcohol if: ? Your health care provider tells you not to drink. ? You are pregnant, may be pregnant, or are planning to become pregnant.  If you drink alcohol: ? Limit how much you have to 0-1 drink a day. ? Be aware of how much alcohol is in your drink. In the [place] one drink equals one 12 oz bottle of beer (355 mL), one 5 oz glass of wine (148 mL), or one 1 oz glass of hard liquor (44 mL). Lifestyle  Take daily care of your teeth and gums.  Stay active. Exercise for at least 30 minutes on 5 or more days each week.  Do not use any products that contain nicotine or tobacco, such as cigarettes, e-cigarettes, and chewing tobacco. If you need help quitting, ask your health care provider.  If you are sexually active, practice safe sex. Use a condom or other form of birth control (contraception) in order to prevent pregnancy and STIs (sexually transmitted infections).  If told by your health care provider, take low-dose aspirin daily starting at age 50. What's next?  Visit your health care provider once a year for a well check visit.  Ask your health care provider how often you should have your eyes and teeth checked.  Stay up to date on all vaccines. This information is not intended to replace advice given to you by your health care provider. Make  sure you discuss any questions you have with your health care provider. Document Revised: 02/02/2018 Document Reviewed: 02/02/2018 Elsevier Patient Education  2020 [employer]  

## 2020-04-26 LAB — FSH/LH
FSH: 9.2 m[IU]/mL
LH: 14 m[IU]/mL

## 2020-04-26 LAB — ESTRADIOL: Estradiol: 325 pg/mL

## 2020-05-02 LAB — CYTOLOGY - PAP
Comment: NEGATIVE
Comment: NEGATIVE
HPV 16: NEGATIVE
HPV 18 / 45: NEGATIVE
High risk HPV: POSITIVE — AB

## 2020-05-12 ENCOUNTER — Encounter: Payer: BC Managed Care – PPO | Admitting: Certified Nurse Midwife

## 2020-05-12 ENCOUNTER — Other Ambulatory Visit: Payer: BC Managed Care – PPO

## 2020-05-13 ENCOUNTER — Encounter: Payer: BC Managed Care – PPO | Admitting: Obstetrics and Gynecology

## 2020-05-22 ENCOUNTER — Ambulatory Visit (INDEPENDENT_AMBULATORY_CARE_PROVIDER_SITE_OTHER): Payer: BC Managed Care – PPO

## 2020-05-22 ENCOUNTER — Other Ambulatory Visit: Payer: Self-pay

## 2020-05-22 ENCOUNTER — Encounter: Payer: Self-pay | Admitting: Certified Nurse Midwife

## 2020-05-22 ENCOUNTER — Ambulatory Visit (INDEPENDENT_AMBULATORY_CARE_PROVIDER_SITE_OTHER): Payer: BC Managed Care – PPO | Admitting: Certified Nurse Midwife

## 2020-05-22 VITALS — BP 126/87 | HR 74 | Ht 64.0 in | Wt 163.5 lb

## 2020-05-22 DIAGNOSIS — R7989 Other specified abnormal findings of blood chemistry: Secondary | ICD-10-CM

## 2020-05-22 DIAGNOSIS — Z8742 Personal history of other diseases of the female genital tract: Secondary | ICD-10-CM | POA: Diagnosis not present

## 2020-05-22 DIAGNOSIS — D251 Intramural leiomyoma of uterus: Secondary | ICD-10-CM

## 2020-05-22 DIAGNOSIS — N83201 Unspecified ovarian cyst, right side: Secondary | ICD-10-CM

## 2020-05-22 DIAGNOSIS — Z01419 Encounter for gynecological examination (general) (routine) without abnormal findings: Secondary | ICD-10-CM | POA: Diagnosis not present

## 2020-05-22 DIAGNOSIS — N926 Irregular menstruation, unspecified: Secondary | ICD-10-CM

## 2020-05-22 NOTE — Patient Instructions (Signed)
Ovarian Cyst     An ovarian cyst is a fluid-filled sac that forms on an ovary. The ovaries are small organs that produce eggs in women. Various types of cysts can form on the ovaries. Some may cause symptoms and require treatment. Most ovarian cysts go away on their own, are not cancerous (are benign), and do not cause problems. Common types of ovarian cysts include:  Functional (follicle) cysts. ? Occur during the menstrual cycle, and usually go away with the next menstrual cycle if you do not get pregnant. ? Usually cause no symptoms.  Endometriomas. ? Are cysts that form from the tissue that lines the uterus (endometrium). ? Are sometimes called "chocolate cysts" because they become filled with blood that turns brown. ? Can cause pain in the lower abdomen during intercourse and during your period.  Cystadenoma cysts. ? Develop from cells on the outside surface of the ovary. ? Can get very large and cause lower abdomen pain and pain with intercourse. ? Can cause severe pain if they twist or break open (rupture).  Dermoid cysts. ? Are sometimes found in both ovaries. ? May contain different kinds of body tissue, such as skin, teeth, hair, or cartilage. ? Usually do not cause symptoms unless they get very big.  Theca lutein cysts. ? Occur when too much of a certain hormone (human chorionic gonadotropin) is produced and overstimulates the ovaries to produce an egg. ? Are most common after having procedures used to assist with the conception of a baby (in vitro fertilization). What are the causes? Ovarian cysts may be caused by:  Ovarian hyperstimulation syndrome. This is a condition that can develop from taking fertility medicines. It causes multiple large ovarian cysts to form.  Polycystic ovarian syndrome (PCOS). This is a common hormonal disorder that can cause ovarian cysts, as well as problems with your period or fertility. What increases the risk? The following factors may  make you more likely to develop ovarian cysts:  Being overweight or obese.  Taking fertility medicines.  Taking certain forms of hormonal birth control.  Smoking. What are the signs or symptoms? Many ovarian cysts do not cause symptoms. If symptoms are present, they may include:  Pelvic pain or pressure.  Pain in the lower abdomen.  Pain during sex.  Abdominal swelling.  Abnormal menstrual periods.  Increasing pain with menstrual periods. How is this diagnosed? These cysts are commonly found during a routine pelvic exam. You may have tests to find out more about the cyst, such as:  Ultrasound.  X-ray of the pelvis.  CT scan.  MRI.  Blood tests. How is this treated? Many ovarian cysts go away on their own without treatment. Your health care provider may want to check your cyst regularly for 2-3 months to see if it changes. If you are in menopause, it is especially important to have your cyst monitored closely because menopausal women have a higher rate of ovarian cancer. When treatment is needed, it may include:  Medicines to help relieve pain.  A procedure to drain the cyst (aspiration).  Surgery to remove the whole cyst.  Hormone treatment or birth control pills. These methods are sometimes used to help dissolve a cyst. Follow these instructions at home:  Take over-the-counter and prescription medicines only as told by your health care provider.  Do not drive or use heavy machinery while taking prescription pain medicine.  Get regular pelvic exams and Pap tests as often as told by your health care provider.    Return to your normal activities as told by your health care provider. Ask your health care provider what activities are safe for you.  Do not use any products that contain nicotine or tobacco, such as cigarettes and e-cigarettes. If you need help quitting, ask your health care provider.  Keep all follow-up visits as told by your health care provider.  This is important. Contact a health care provider if:  Your periods are late, irregular, or painful, or they stop.  You have pelvic pain that does not go away.  You have pressure on your bladder or trouble emptying your bladder completely.  You have pain during sex.  You have any of the following in your abdomen: ? A feeling of fullness. ? Pressure. ? Discomfort. ? Pain that does not go away. ? Swelling.  You feel generally ill.  You become constipated.  You lose your appetite.  You develop severe acne.  You start to have more body hair and facial hair.  You are gaining weight or losing weight without changing your exercise and eating habits.  You think you may be pregnant. Get help right away if:  You have abdominal pain that is severe or gets worse.  You cannot eat or drink without vomiting.  You suddenly develop a fever.  Your menstrual period is much heavier than usual. This information is not intended to replace advice given to you by your health care provider. Make sure you discuss any questions you have with your health care provider. Document Revised: 08/22/2017 Document Reviewed: 10/26/2015 Elsevier Patient Education  2020 [employer].   Uterine Fibroids  Uterine fibroids are lumps of tissue (tumors) in your womb (uterus). They are not cancer (are benign). Most women with this condition do not need treatment. Sometimes fibroids can affect your ability to have children (your fertility). If that happens, you may need surgery to take out the fibroids. Follow these instructions at home:  Take over-the-counter and prescription medicines only as told by your doctor. Your doctor may suggest NSAIDs (such as aspirin or ibuprofen) to help with pain.  Ask your doctor if you should: ? Take iron pills. ? Eat more foods that have iron in them, such as dark green, leafy vegetables.  If directed, apply heat to your back or belly to reduce pain. Use the heat source  that your doctor recommends, such as a moist heat pack or a heating pad. ? Put a towel between your skin and the heat source. ? Leave the heat on for 20-30 minutes. ? Remove the heat if your skin turns bright red. This is especially important if you are unable to feel pain, heat, or cold. You may have a greater risk of getting burned.  Pay close attention to your period (menstrual) cycles. Tell your doctor about any changes, such as: ? A heavier blood flow than usual. ? Needing to use more pads or tampons than normal. ? A change in how many days your period lasts. ? A change in symptoms that come with your period, such as cramps or back pain.  Keep all follow-up visits as told by your doctor. This is important. Your doctor may need to watch your fibroids over time for any changes. Contact a doctor if you:  Have pain that does not get better with medicine or heat, such as pain or cramps in: ? Your back. ? The area between your hip bones (pelvic area). ? Your belly.  Have new bleeding between your periods.  Have  more bleeding during or between your periods.  Feel very tired or weak.  Feel light-headed. Get help right away if you:  Pass out (faint).  Have pain in the area between your hip bones that suddenly gets worse.  Have bleeding that soaks a tampon or pad in 30 minutes or less. Summary  Uterine fibroids are lumps of tissue (tumors) in your womb (uterus). They are not cancer.  The only treatment that most women need is taking aspirin or ibuprofen for pain.  Contact a doctor if you have pain or cramps that do not get better with medicine.  Make sure you know what symptoms you should get help for right away. This information is not intended to replace advice given to you by your health care provider. Make sure you discuss any questions you have with your health care provider. Document Revised: 05/06/2017 Document Reviewed: 04/19/2017 Elsevier Patient Education  2020  [employer].

## 2020-05-22 NOTE — Progress Notes (Signed)
GYN ENCOUNTER NOTE  Subjective:       Dana Wolf is a 49 y.o. [phone] female here for ultrasound results review.   Last seen in office for [place] on 04/25/2020; for further details, please see previous note.   Denies difficulty breathing or respiratory distress, chest pain, abdominal pain, excessive vaginal bleeding, dysuria, and leg pain or swelling.    Gynecologic History  Patient's last menstrual period was 04/03/2020.  Contraception: tubal ligation  Last Pap: 04/2020. Results were: LSIL/HPV+, neg 16, 18/45  Last mammogram: 06/2019. Results were: BI-RADS 2  Obstetric History  OB History  Gravida Para Term Preterm AB Living  3 3 2 1   3   SAB IAB Ectopic Multiple Live Births          3    # Outcome Date GA Lbr Len/2nd Weight Sex Delivery Anes PTL Lv  3 Term 02/28/98   6 lb 1 oz (2.75 kg) F Vag-Spont None  LIV  2 Preterm 10/29/95 [redacted]w[redacted]d  5 lb 5 oz (2.41 kg) M Vag-Spont None  LIV     Complications: Ruptured, membranes, premature  1 Term 02/16/92   7 lb 1 oz (3.204 kg) F Vag-Spont None  LIV    Past Medical History:  Diagnosis Date  . 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 COLPOSCOPY    . TUBAL LIGATION      No current outpatient medications on file prior to visit.   No current facility-administered medications on file prior to visit.    No Known Allergies  Social History   Socioeconomic History  . Marital status: Married    Spouse name: Not on file  . Number of children: Not on file  . Years of education: Not on file  . Highest education level: Not on file  Occupational History  . Not on file  Tobacco Use  . Smoking status: Never Smoker  . Smokeless tobacco: Never Used  Vaping Use  . Vaping Use: Never used  Substance and Sexual Activity  . Alcohol use: No    Alcohol/week: 0.0 standard drinks  . Drug use: No  . Sexual activity: Yes    Birth  control/protection: Surgical    Comment: tubal ligation   Other Topics Concern  . Not on file  Social History Narrative  . Not on file   Social Determinants of Health   Financial Resource Strain: Not on file  Food Insecurity: Not on file  Transportation Needs: Not on file  Physical Activity: Not on file  Stress: Not on file  Social Connections: Not on file  Intimate Partner Violence: Not on file    Family History  Adopted: Yes  Problem Relation Age of Onset  . Breast cancer Neg Hx   . Ovarian cancer Neg Hx   . Colon cancer Neg Hx     The following portions of the patient's history were reviewed and updated as appropriate: allergies, current medications, past family history, past medical history, past social history, past surgical history and problem list.  Review of Systems  ROS negative except as noted above. Information obtained from patient.   Objective:   BP 126/87   Pulse 74   Ht 5\' 4"  (1.626 m)   Wt 163 lb 8 oz (74.2 kg)   LMP 04/03/2020   BMI 28.06 kg/m    CONSTITUTIONAL: Well-developed, well-nourished female in no  acute distress.   PHYSICAL EXAM: Not indicated.    ULTRASOUND REPORT  Location: [facility]  Date of Service: 05/22/2020    TECHNIQUE: Both transabdominal and transvaginal ultrasound examinations of the pelvis were performed. Transabdominal technique was performed for global imaging of the pelvis including uterus, ovaries, adnexal regions, and pelvic cul-de-sac. It was necessary to proceed with endovaginal exam following the transabdominal exam to visualize the endometrium and adnexa.  Indications:Enlarged Uterus Findings:  The uterus is anteverted and measures 10.2 x 4.0 x 4.9 cm.. Echo texture is heterogenous with evidence of focal masses. Within the uterus are multiple suspected fibroids measuring: Fibroid 1:Anterior Im 2.9 x 2.1x 2.4 cm.   The Endometrium measures 11 mm.  Right Ovary measures 3.7 x 3.6 x 3.1 cm. It is  normal in appearance. Hypoechoic lesion with smooth walls and good through transmission                                                                                                                       measuring 3.4 x 2.3 x 2.8 cm. Left Ovary measures 2.3 x 1.8 x 2.1 cm. It is normal in appearance. Survey of the adnexa demonstrates no adnexal masses. There is no free fluid in the cul de sac.  Impression: 1. Rt ovarian simple cyst. 2. Fibroid uterus as described above.  Recommendations: 1.Clinical correlation with the patient's History and Physical Exam.  Assessment:   1. Fibroids, intramural   2. Right ovarian cyst   Plan:   Ultrasound findings reviewed with patient, verbalized understanding.   Reviewed red flag symptoms and when to call.   Encouraged to keep scheduled colposcopy next week.   RTC x 6-8 weeks for follow up ultrasound or sooner if needed.    Dani Gobble, CNM [facility], [facility] 05/22/20 4:10 PM

## 2020-05-28 ENCOUNTER — Encounter: Payer: BC Managed Care – PPO | Admitting: Obstetrics and Gynecology

## 2020-06-08 ENCOUNTER — Telehealth: Payer: BC Managed Care – PPO | Admitting: Family

## 2020-06-08 DIAGNOSIS — Z20822 Contact with and (suspected) exposure to covid-19: Secondary | ICD-10-CM

## 2020-06-08 MED ORDER — BENZONATATE 100 MG PO CAPS: 100.0000 mg | ORAL_CAPSULE | Freq: Three times a day (TID) | ORAL | 0 refills | Status: DC | PRN

## 2020-06-08 MED ORDER — FLUTICASONE PROPIONATE 50 MCG/ACT NA SUSP: 2.0000 | Freq: Every day | NASAL | 6 refills | Status: DC

## 2020-06-08 NOTE — Progress Notes (Signed)
E-Visit for Corona Virus Screening  Your current symptoms could be consistent with the coronavirus.  Many health care providers can now test patients at their office but not all are.  [facility] has multiple testing sites. For information on our [place] testing locations and hours go to [email or web address]  We are enrolling you in our [place] for [place] . Daily you will receive a questionnaire within the [facility] website. Our COVID 19 response team will be monitoring your responses daily.  Testing Information: The COVID-19 Community Testing sites are testing BY APPOINTMENT ONLY.  You can schedule online at [email or web address]  If you do not have access to a smart phone or computer you may call [phone] for an appointment.   Additional testing sites in the Community:  . For [facility] Testing sites in [facility]  [email or web address]  . For Pop-up testing sites in [place]  [email or web address]  . For [facility] [email or web address]  . For [facility] testing in [place] and [employer] [email or web address]  . For [facility] testing in [facility]   [email or web address]  For  more information about community testing call [phone]   Please quarantine yourself while awaiting your test results. Please stay home for a minimum of 10 days from the first day of illness with improving symptoms and you have had 24 hours of no fever (without the use of Tylenol (Acetaminophen) Motrin (Ibuprofen) or any fever reducing medication).  Also - Do not get tested prior to returning to work because once you have had a positive test the test can stay  positive for more than a month in some cases.   You should wear a mask or cloth face covering over your nose and mouth if you must be around other people or animals, including pets (even at home). Try to stay at least 6 feet away from other people. This will protect the people around you.  Please continue good preventive care measures, including:  frequent hand-washing, avoid touching your face, cover coughs/sneezes, stay out of crowds and keep a 6 foot distance from others.  COVID-19 is a respiratory illness with symptoms that are similar to the flu. Symptoms are typically mild to moderate, but there have been cases of severe illness and death due to the virus.   The following symptoms may appear 2-14 days after exposure: . Fever . Cough . Shortness of breath or difficulty breathing . Chills . Repeated shaking with chills . Muscle pain . Headache . Sore throat . New loss of taste or smell . Fatigue . Congestion or runny nose . Nausea or vomiting . Diarrhea  Go to the nearest hospital ED for assessment if fever/cough/breathlessness are severe or illness seems like a threat to life.  It is vitally important that if you feel that you have an infection such as this virus or any other virus that you stay home and away from places where you may spread it to others.  You should avoid contact with people age 35 and older.   You can use medication such as A prescription cough medication called Tessalon Perles 100 mg. You may take 1-2 capsules every 8 hours as needed for cough and A prescription for Fluticasone nasal spray 2 sprays in each nostril one time per day.  You may also take acetaminophen (Tylenol) as needed for fever.  Reduce your risk of any infection by using the same precautions used for avoiding the common cold or flu:  Marland Kitchen Wash your hands often with  soap and warm water for at least 20 seconds.  If soap and water are not readily available, use an alcohol-based hand sanitizer with at  least 60% alcohol.  . If coughing or sneezing, cover your mouth and nose by coughing or sneezing into the elbow areas of your shirt or coat, into a tissue or into your sleeve (not your hands). . Avoid shaking hands with others and consider head nods or verbal greetings only. . Avoid touching your eyes, nose, or mouth with unwashed hands.  . Avoid close contact with people who are sick. . Avoid places or events with large numbers of people in one location, like concerts or sporting events. . Carefully consider travel plans you have or are making. . If you are planning any travel outside or inside the [place], visit the CDC's Travelers' Health webpage for the latest health notices. . If you have some symptoms but not all symptoms, continue to monitor at home and seek medical attention if your symptoms worsen. . If you are having a medical emergency, call 911.  HOME CARE . Only take medications as instructed by your medical team. . Drink plenty of fluids and get plenty of rest. . A steam or ultrasonic humidifier can help if you have congestion.   GET HELP RIGHT AWAY IF YOU HAVE EMERGENCY WARNING SIGNS** FOR COVID-19. If you or someone is showing any of these signs seek emergency medical care immediately. Call 911 or proceed to your closest emergency facility if: . You develop worsening high fever. . Trouble breathing . Bluish lips or face . Persistent pain or pressure in the chest . New confusion . Inability to wake or stay awake . You cough up blood. . Your symptoms become more severe  **This list is not all possible symptoms. Contact your medical provider for any symptoms that are sever or concerning to you.  MAKE SURE YOU   Understand these instructions.  Will watch your condition.  Will get help right away if you are not doing well or get worse.  Your e-visit answers were reviewed by a board certified advanced clinical practitioner to complete your personal care plan.  Depending on  the condition, your plan could have included both over the counter or prescription medications.  If there is a problem please reply once you have received a response from your provider.  Your safety is important to [place].  If you have drug allergies check your prescription carefully.    You can use MyChart to ask questions about today's visit, request a non-urgent call back, or ask for a work or school excuse for 24 hours related to this e-Visit. If it has been greater than 24 hours you will need to follow up with your provider, or enter a new e-Visit to address those concerns. You will get an e-mail in the next two days asking about your experience.  I hope that your e-visit has been valuable and will speed your recovery. Thank you for using e-visits.   Approximately 5 minutes was spent documenting and reviewing patient's chart.

## 2020-07-10 ENCOUNTER — Other Ambulatory Visit: Payer: BC Managed Care – PPO

## 2020-07-10 ENCOUNTER — Encounter: Payer: BC Managed Care – PPO | Admitting: Certified Nurse Midwife

## 2020-07-31 ENCOUNTER — Encounter: Payer: BC Managed Care – PPO | Admitting: Certified Nurse Midwife

## 2020-07-31 ENCOUNTER — Other Ambulatory Visit: Payer: BC Managed Care – PPO

## 2020-07-31 ENCOUNTER — Telehealth: Payer: Self-pay

## 2020-07-31 NOTE — Telephone Encounter (Signed)
Called pt lmtrc move pts appt

## 2020-08-07 ENCOUNTER — Other Ambulatory Visit: Payer: BC Managed Care – PPO

## 2020-08-07 ENCOUNTER — Encounter: Payer: BC Managed Care – PPO | Admitting: Certified Nurse Midwife

## 2020-09-24 ENCOUNTER — Ambulatory Visit
Admission: RE | Admit: 2020-09-24 | Discharge: 2020-09-24 | Disposition: A | Discharge status: Home / Self Care | Payer: BC Managed Care – PPO | Source: Ambulatory Visit | Attending: Certified Nurse Midwife | Admitting: Certified Nurse Midwife

## 2020-09-24 ENCOUNTER — Other Ambulatory Visit: Payer: Self-pay

## 2020-09-24 DIAGNOSIS — Z1231 Encounter for screening mammogram for malignant neoplasm of breast: Secondary | ICD-10-CM | POA: Diagnosis not present

## 2020-09-24 DIAGNOSIS — Z01419 Encounter for gynecological examination (general) (routine) without abnormal findings: Secondary | ICD-10-CM

## 2020-09-30 ENCOUNTER — Telehealth: Payer: Self-pay | Admitting: Certified Nurse Midwife

## 2020-09-30 ENCOUNTER — Other Ambulatory Visit: Payer: Self-pay

## 2020-09-30 ENCOUNTER — Encounter: Payer: Self-pay | Admitting: Family Medicine

## 2020-09-30 ENCOUNTER — Ambulatory Visit (INDEPENDENT_AMBULATORY_CARE_PROVIDER_SITE_OTHER): Payer: BC Managed Care – PPO | Admitting: Family Medicine

## 2020-09-30 VITALS — BP 122/80 | HR 92 | Temp 98.0°F | Resp 18 | Wt 164.7 lb

## 2020-09-30 DIAGNOSIS — M7989 Other specified soft tissue disorders: Secondary | ICD-10-CM

## 2020-09-30 DIAGNOSIS — M25552 Pain in left hip: Secondary | ICD-10-CM

## 2020-09-30 MED ORDER — MELOXICAM 15 MG PO TABS: 15.0000 mg | ORAL_TABLET | Freq: Every day | ORAL | 2 refills | Status: DC

## 2020-09-30 NOTE — Telephone Encounter (Signed)
Patient states that she has not monthly in 3 month, pt is experience hot flashes and night sweats. Pt is concerned and what's to know if she needs to be seen. Please Advise.

## 2020-09-30 NOTE — Progress Notes (Signed)
Patient ID: Dana Wolf, female    DOB: Mar 29, 1971, 50 y.o.   MRN: [id number]  PCP: Delsa Grana, PA-C  Chief Complaint  Patient presents with   Hip Pain    Left hip ongoing   Mass    Under right arm for 2 weeks   Consult    For lab work yearly    Subjective:   Dana Wolf is a 50 y.o. 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   No Known Allergies   Social History   Tobacco Use   Smoking status: Never Smoker   Smokeless tobacco: Never Used  Vaping Use   Vaping Use: Never used  Substance Use Topics   Alcohol use: No    Alcohol/week: 0.0 standard drinks   Drug use: No      Chart Review Today: I personally reviewed active problem list, medication list, allergies, family history, social history, health maintenance, notes from last encounter, lab results, imaging with the patient/caregiver today.   Review of Systems  Constitutional: Negative.   HENT: Negative.    Eyes: Negative.   Respiratory: Negative.    Cardiovascular: Negative.   Gastrointestinal: Negative.   Endocrine: Negative.   Genitourinary: Negative.   Musculoskeletal: Negative.   Skin: Negative.   Allergic/Immunologic: Negative.   Neurological: Negative.   Hematological: Negative.   Psychiatric/Behavioral: Negative.    All other systems reviewed and are negative.     Objective:   Vitals:   09/30/20 1538  BP: 122/80   Pulse: 92  Resp: 18  Temp: 98 F (36.7 C)  TempSrc: Oral  SpO2: 99%  Weight: 164 lb 11.2 oz (74.7 kg)    Body mass index is 28.27 kg/m.  Physical Exam Vitals and nursing note reviewed.  Constitutional:      General: She is not in acute distress.    Appearance: Normal appearance. She is normal weight. She is not ill-appearing, toxic-appearing or diaphoretic.  HENT:     Head: Normocephalic and atraumatic.  Cardiovascular:     Rate and Rhythm: Normal rate and regular rhythm.     Pulses: Normal pulses.     Heart sounds: Normal heart sounds.  Pulmonary:     Effort: Pulmonary effort is normal.     Breath sounds: Normal breath sounds.  Abdominal:     General: Bowel sounds are normal.     Palpations: Abdomen is soft.  Musculoskeletal:     Right hip: Normal.     Left hip: Tenderness present. No deformity, lacerations, bony tenderness or crepitus. Normal range of motion. Normal strength.     Comments: Slightly discomfort with left hip internal rotation, mild tenderness to palpation over greater trochanter area No midline tenderness from cervical to lumbar spine, no paraspinal muscle tenderness, negative straight leg raise bilaterally  Lymphadenopathy:     Cervical: No cervical adenopathy.  Comments: Left axilla with slight fullness visibly different when compared to right axilla however no palpable nodules or lymphadenopathy no edema, erythema, induration  Skin:    Coloration: Skin is not jaundiced or pale.     Findings: No erythema or lesion.  Neurological:     Mental Status: Mental status is at baseline.     Motor: No weakness.     Gait: Gait normal.  Psychiatric:        Mood and Affect: Mood normal.        Behavior: Behavior normal.     Results for orders placed or performed in visit on 04/25/20  Estradiol  Result Value Ref Range   Estradiol 325.0 pg/mL  FSH/LH  Result Value Ref Range   LH 14.0 mIU/mL   FSH 9.2 mIU/mL  Cytology - PAP  Result Value Ref Range    High risk HPV Positive (A)    HPV 16 Negative    HPV 18 / 45 Negative    Adequacy      Satisfactory for evaluation; transformation zone component PRESENT.   Diagnosis - Low grade squamous intraepithelial lesion (LSIL) (A)    Comment Normal Reference Range HPV - Negative    Comment Normal Reference Range HPV 16 18 45 -Negative        Assessment & Plan:     ICD-10-CM   1. Left hip pain  M25.552 DG Hip Unilat W OR W/O Pelvis 2-3 Views Left   Can get x-rays to evaluate joint space but I do think she might have IT band syndrome versus bursitis    2. Left axillary swelling  M79.89    no concerning lymphadenopathy no edema induration or erythema concerning for cellulitis, recent negative mammo - can get [place] if fullness persists     Mobic rx for hip pain, offered PT referral and sport medicine referral for further eval and tx   Recommended f/up in 3-4 weeks if left axilla swelling or fullness persists - can get imaging done to further eval    Delsa Grana, PA-C 09/30/20 3:42 PM

## 2020-09-30 NOTE — Patient Instructions (Signed)
Iliotibial Band Syndrome Rehab Ask your health care provider which exercises are safe for you. Do exercises exactly as told by your health care provider and adjust them as directed. It is normal to feel mild stretching, pulling, tightness, or discomfort as you do these exercises. Stop right away if you feel sudden pain or your pain gets significantly worse. Do not begin these exercises until told by your health care provider. Stretching and range-of-motion exercises These exercises warm up your muscles and joints and improve the movement and flexibility of your hip and pelvis. Quadriceps stretch, prone 1. Lie on your abdomen (prone position) on a firm surface, such as a bed or padded floor. 2. Bend your left / right knee and reach back to hold your ankle or pant leg. If you cannot reach your ankle or pant leg, loop a belt around your foot and grab the belt instead. 3. Gently pull your heel toward your buttocks. Your knee should not slide out to the side. You should feel a stretch in the front of your thigh and knee (quadriceps). 4. Hold this position for __________ seconds. Repeat __________ times. Complete this exercise __________ times a day.   Iliotibial band stretch An iliotibial band is a strong band of muscle tissue that runs from the outer side of your hip to the outer side of your thigh and knee. 1. Lie on your side with your left / right leg in the top position. 2. Bend both of your knees and grab your left / right ankle. Stretch out your bottom arm to help you balance. 3. Slowly bring your top knee back so your thigh goes behind your trunk. 4. Slowly lower your top leg toward the floor until you feel a gentle stretch on the outside of your left / right hip and thigh. If you do not feel a stretch and your knee will not fall farther, place the heel of your other foot on top of your knee and pull your knee down toward the floor with your foot. 5. Hold this position for __________  seconds. Repeat __________ times. Complete this exercise __________ times a day.   Strengthening exercises These exercises build strength and endurance in your hip and pelvis. Endurance is the ability to use your muscles for a long time, even after they get tired. Straight leg raises, side-lying This exercise strengthens the muscles that rotate the leg at the hip and move it away from your body (hip abductors). 1. Lie on your side with your left / right leg in the top position. Lie so your head, shoulder, hip, and knee line up. You may bend your bottom knee to help you balance. 2. Roll your hips slightly forward so your hips are stacked directly over each other and your left / right knee is facing forward. 3. Tense the muscles in your outer thigh and lift your top leg 4-6 inches (10-15 cm). 4. Hold this position for __________ seconds. 5. Slowly lower your leg to return to the starting position. Let your muscles relax completely before doing another repetition. Repeat __________ times. Complete this exercise __________ times a day.   Leg raises, prone This exercise strengthens the muscles that move the hips backward (hip extensors). 1. Lie on your abdomen (prone position) on your bed or a firm surface. You can put a pillow under your hips if that is more comfortable for your lower back. 2. Bend your left / right knee so your foot is straight up in the air.   3. Squeeze your buttocks muscles and lift your left / right thigh off the bed. Do not let your back arch. 4. Tense your thigh muscle as hard as you can without increasing any knee pain. 5. Hold this position for __________ seconds. 6. Slowly lower your leg to return to the starting position and allow it to relax completely. Repeat __________ times. Complete this exercise __________ times a day. Hip hike 1. Stand sideways on a bottom step. Stand on your left / right leg with your other foot unsupported next to the step. You can hold on to a  railing or wall for balance if needed. 2. Keep your knees straight and your torso square. Then lift your left / right hip up toward the ceiling. 3. Slowly let your left / right hip lower toward the floor, past the starting position. Your foot should get closer to the floor. Do not lean or bend your knees. Repeat __________ times. Complete this exercise __________ times a day. This information is not intended to replace advice given to you by your health care provider. Make sure you discuss any questions you have with your health care provider. Document Revised: 08/01/2019 Document Reviewed: 08/01/2019 Elsevier Patient Education  2021 [place].     Hip Bursitis  Hip bursitis is swelling of one or more fluid-filled sacs (bursae) in your hip joint. This condition can cause pain, and your symptoms may come and go over time. What are the causes?  Repeated use of your hip muscles.  Injury to the hip.  Weak butt muscles.  Bone spurs.  Infection. In some cases, the cause may not be known. What increases the risk? You are more likely to develop this condition if:  You had a past hip injury or hip surgery.  You have a condition, such as arthritis, gout, diabetes, or thyroid disease.  You have spine problems.  You have one leg that is shorter than the other.  You run a lot or do long-distance running.  You play sports where there is a risk of injury or falling, such as football, martial arts, or skiing. What are the signs or symptoms? Symptoms may come and go, and they often include:  Pain in the hip or groin area. Pain may get worse when you move your hip.  Tenderness and swelling of the hip. In rare cases, the bursa may become infected. If this happens, you may get a fever, as well as have warmth and redness in the hip area. How is this treated? This condition is treated by:  Resting your hip.  Icing your hip.  Wrapping the hip area with an elastic bandage  (compression wrap).  Keeping the hip raised. Other treatments may include medicine, draining fluid out of the bursa, or using crutches, a cane, or a walker. Surgery may be needed, but this is rare. Long-term treatment may include doing exercises to help your strength and flexibility. It may also include lifestyle changes like losing weight to lessen the strain on your hip. Follow these instructions at home: Managing pain, stiffness, and swelling  If told, put ice on the painful area. ? Put ice in a plastic bag. ? Place a towel between your skin and the bag. ? Leave the ice on for 20 minutes, 2-3 times a day.  Raise your hip by putting a pillow under your hips while you lie down. Stop if you feel pain.  If told, put heat on the affected area. Do this as often as  told by your doctor. Use a moist heat pack or a heating pad as told by your doctor. ? Place a towel between your skin and the heat source. ? Leave the heat on for 20-30 minutes. ? Take off the heat if your skin turns bright red. This is very important if you are unable to feel pain, heat, or cold. You may have a greater risk of getting burned.      Activity  Do not use your hip to support your body weight until your doctor says that you can.  Use crutches, a cane, or a walker as told by your doctor.  If the affected leg is one that you use to drive, ask your doctor if it is safe to drive.  Rest and protect your hip as much as you can until you feel better.  Return to your normal activities as told by your doctor. Ask your doctor what activities are safe for you.  Do exercises as told by your doctor. General instructions  Take over-the-counter and prescription medicines only as told by your doctor.  Gently rub and stretch your injured area as often as is comfortable.  Wear elastic bandages only as told by your doctor.  If one of your legs is shorter than the other, get fitted for a shoe insert or orthotic.  Keep a  healthy weight. Follow instructions from your doctor.  Keep all follow-up visits as told by your doctor. This is important. How is this prevented?  Exercise regularly, as told by your doctor.  Wear the right shoes for the sport you play.  Warm up and stretch before being active. Cool down and stretch after being active.  Take breaks often from repeated activity.  Avoid activities that bother your hip or cause pain.  Avoid sitting down for a long time. Where to find more information  American Academy [employer]: orthoinfo.aaos.org Contact a doctor if:  You have a fever.  You have new symptoms.  You have trouble walking or doing everyday activities.  You have pain that gets worse or does not get better with medicine.  Your skin around your hip is red.  You get a feeling of warmth in your hip area. Get help right away if:  You cannot move your hip.  You have very bad pain.  You cannot control the muscles in your feet. Summary  Hip bursitis is swelling of one or more fluid-filled sacs (bursae) in your hip joint.  Symptoms often come and go over time.  This condition is often treated by resting and icing the hip. It also may help to keep the area raised and wrapped in an elastic bandage. Other treatments may be needed. This information is not intended to replace advice given to you by your health care provider. Make sure you discuss any questions you have with your health care provider. Document Revised: 03/26/2019 Document Reviewed: 01/30/2018 Elsevier Patient Education  2021 [employer].

## 2020-10-01 ENCOUNTER — Telehealth: Payer: Self-pay | Admitting: Certified Nurse Midwife

## 2020-10-01 NOTE — Telephone Encounter (Signed)
Patient called 09-30-2020 asking about an [place] that was suppose to be ordered, I made patient aware ti would look into that. I found where the [place] was ordered in 05-22-2020 for fibroids, pt was scheduled for [facility] and it was cancelled / no showed multiple times. I left message for patient 10-01-2020 @ 3:00pm making her aware I did look into it and that the order was from December- I explained that we are now going through [facility] and we can defiantly ask for first available if she is interested. Waiting on patient to respond to proceed.

## 2020-10-02 ENCOUNTER — Other Ambulatory Visit (HOSPITAL_COMMUNITY)
Admission: RE | Admit: 2020-10-02 | Discharge: 2020-10-02 | Disposition: A | Discharge status: Home / Self Care | Payer: BC Managed Care – PPO | Source: Ambulatory Visit | Attending: Obstetrics and Gynecology | Admitting: Obstetrics and Gynecology

## 2020-10-02 ENCOUNTER — Encounter: Payer: Self-pay | Admitting: Obstetrics and Gynecology

## 2020-10-02 ENCOUNTER — Ambulatory Visit (INDEPENDENT_AMBULATORY_CARE_PROVIDER_SITE_OTHER): Payer: BC Managed Care – PPO | Admitting: Obstetrics and Gynecology

## 2020-10-02 ENCOUNTER — Other Ambulatory Visit: Payer: Self-pay

## 2020-10-02 VITALS — BP 102/64 | HR 78 | Ht 64.0 in | Wt 164.3 lb

## 2020-10-02 DIAGNOSIS — N72 Inflammatory disease of cervix uteri: Secondary | ICD-10-CM

## 2020-10-02 DIAGNOSIS — B977 Papillomavirus as the cause of diseases classified elsewhere: Secondary | ICD-10-CM | POA: Insufficient documentation

## 2020-10-02 DIAGNOSIS — N871 Moderate cervical dysplasia: Secondary | ICD-10-CM | POA: Diagnosis not present

## 2020-10-02 DIAGNOSIS — R87612 Low grade squamous intraepithelial lesion on cytologic smear of cervix (LGSIL): Secondary | ICD-10-CM

## 2020-10-02 NOTE — Telephone Encounter (Signed)
Patient is scheduled for appointment tomorrow with Dr. Amalia Hailey. May scheduled appointment with me to discuss or address with him further. Thanks, JML

## 2020-10-02 NOTE — Addendum Note (Signed)
Addended by: Durwin Glaze on: 10/02/2020 03:42 PM   Modules accepted: Orders

## 2020-10-02 NOTE — Progress Notes (Signed)
   HPI:  Codee L Boeder is a 50 y.o.  [phone]  who presents today for evaluation and management of abnormal cervical cytology.    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   This is a follow-up after another Pap showing LGSIL and positive HPV.  ROS:  Pertinent items noted in HPI and remainder of comprehensive ROS otherwise negative.  OB History  Gravida Para Term Preterm AB Living  3 3 2 1   3   SAB IAB Ectopic Multiple Live Births          3    # Outcome Date GA Lbr Len/2nd Weight Sex Delivery Anes PTL Lv  3 Term 02/28/98   6 lb 1 oz (2.75 kg) F Vag-Spont None  LIV  2 Preterm 10/29/95 [redacted]w[redacted]d  5 lb 5 oz (2.41 kg) M Vag-Spont None  LIV     Complications: Ruptured, membranes, premature  1 Term 02/16/92   7 lb 1 oz (3.204 kg) F Vag-Spont None  LIV    Past Medical History:  Diagnosis Date  . 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 COLPOSCOPY    . TUBAL LIGATION      SOCIAL HISTORY: Social History   Substance and Sexual Activity  Alcohol Use No  . Alcohol/week: 0.0 standard drinks   Social History   Substance and Sexual Activity  Drug Use No     Family History  Adopted: Yes  Problem Relation Age of Onset  . Breast cancer Neg Hx   . Ovarian cancer Neg Hx   . Colon cancer Neg Hx     ALLERGIES:  Patient has no known allergies.  She has a current medication list which includes the following prescription(s): meloxicam.  Physical Exam: -Vitals:  BP 102/64   Pulse 78   Ht 5\' 4"  (1.626 m)   Wt 164 lb 4.8 oz (74.5 kg)   BMI 28.20 kg/m   PROCEDURE: Colposcopy performed with 4% acetic acid after verbal consent obtained                           -Aceto-white Lesions Location(s): 10 and 2 o'clock.              -Biopsy performed at 10 and 2 o'clock               -ECC indicated and performed: No.     -Biopsy sites made hemostatic  with pressure and Monsel's solution   -Satisfactory colposcopy: Yes.      -Evidence of Invasive cervical CA :  NO  ASSESSMENT:  Uilani L Bucio is a 50 y.o. [id number] here for  1. Low grade squamous intraepithelial lesion on cytologic smear of cervix (LGSIL)   2. High risk human papilloma virus (HPV) infection of cervix   .  PLAN: 1.  I discussed the grading system of pap smears and HPV high risk viral types.  We will discuss management after colpo results return.  No orders of the defined types were placed in this encounter.          F/U  Return in about 2 weeks (around 10/16/2020) for Colpo f/u.  [name] ,MD 10/02/2020,11:39 AM

## 2020-10-06 LAB — SURGICAL PATHOLOGY

## 2020-10-14 ENCOUNTER — Ambulatory Visit: Payer: BC Managed Care – PPO | Admitting: Family Medicine

## 2020-10-16 ENCOUNTER — Ambulatory Visit (INDEPENDENT_AMBULATORY_CARE_PROVIDER_SITE_OTHER): Payer: BC Managed Care – PPO | Admitting: Obstetrics and Gynecology

## 2020-10-16 ENCOUNTER — Other Ambulatory Visit: Payer: Self-pay

## 2020-10-16 ENCOUNTER — Encounter: Payer: Self-pay | Admitting: Obstetrics and Gynecology

## 2020-10-16 VITALS — BP 126/74 | HR 73 | Ht 64.0 in | Wt 161.8 lb

## 2020-10-16 DIAGNOSIS — N871 Moderate cervical dysplasia: Secondary | ICD-10-CM

## 2020-10-16 DIAGNOSIS — N72 Inflammatory disease of cervix uteri: Secondary | ICD-10-CM

## 2020-10-16 DIAGNOSIS — N951 Menopausal and female climacteric states: Secondary | ICD-10-CM | POA: Diagnosis not present

## 2020-10-16 DIAGNOSIS — B977 Papillomavirus as the cause of diseases classified elsewhere: Secondary | ICD-10-CM

## 2020-10-16 NOTE — Progress Notes (Signed)
HPI:      [name] is a 50 y.o. [phone] who LMP was No LMP recorded. (Menstrual status: Perimenopausal).  Subjective:   She presents today for follow-up of colposcopy.  She previously has had abnormal Pap smears and abnormal colposcopies.  Her last colposcopy was CIN-1. In addition, patient states that she has begun having hot flashes and has not had a menstrual period in 3 months. She does describe a history of dysmenorrhea and uterine fibroids but obviously without.  She is not having any issues at this time.    Hx: The following portions of the patient's history were reviewed and updated as appropriate:             She  has a past medical history of ASCUS with positive high risk HPV cervical (08/04/2016). She does not have any pertinent problems on file. She  has a past surgical history that includes Colposcopy and Tubal ligation. Her family history is not on file. She was adopted. She  reports that she has never smoked. She has never used smokeless tobacco. She reports that she does not drink alcohol and does not use drugs. She has a current medication list which includes the following prescription(s): meloxicam. She has No Known Allergies.       Review of Systems:  Review of Systems  Constitutional: Denied constitutional symptoms, night sweats, recent illness, fatigue, fever, insomnia and weight loss.  Eyes: Denied eye symptoms, eye pain, photophobia, vision change and visual disturbance.  Ears/Nose/Throat/Neck: Denied ear, nose, throat or neck symptoms, hearing loss, nasal discharge, sinus congestion and sore throat.  Cardiovascular: Denied cardiovascular symptoms, arrhythmia, chest pain/pressure, edema, exercise intolerance, orthopnea and palpitations.  Respiratory: Denied pulmonary symptoms, asthma, pleuritic pain, productive sputum, cough, dyspnea and wheezing.  Gastrointestinal: Denied, gastro-esophageal reflux, melena, nausea and vomiting.  Genitourinary: See HPI for  additional information.  Musculoskeletal: Denied musculoskeletal symptoms, stiffness, swelling, muscle weakness and myalgia.  Dermatologic: Denied dermatology symptoms, rash and scar.  Neurologic: Denied neurology symptoms, dizziness, headache, neck pain and syncope.  Psychiatric: Denied psychiatric symptoms, anxiety and depression.  Endocrine: Denied endocrine symptoms including hot flashes and night sweats.   Meds:   Current Outpatient Medications on File Prior to Visit  Medication Sig Dispense Refill  . meloxicam (MOBIC) 15 MG tablet Take 1 tablet (15 mg total) by mouth daily. 30 tablet 2   No current facility-administered medications on file prior to visit.          Objective:     Vitals:   10/16/20 1512  BP: 126/74  Pulse: 73   Filed Weights   10/16/20 1512  Weight: 161 lb 12.8 oz (73.4 kg)              colposcopically directed biopsies both revealed CIN-2.  Assessment:    [id number] Patient Active Problem List   Diagnosis Date Noted  . Heavy periods 07/18/2017  . Abnormal breast finding 03/10/2017  . ASCUS with positive high risk HPV cervical 08/04/2016  . Irregular periods 10/30/2015  . Fatigue 10/30/2015     1. CIN II (cervical intraepithelial neoplasia II)   2. Symptomatic menopausal or female climacteric states   3. High risk human papilloma virus (HPV) infection of cervix     CIN-2- this is a progression from her previous diagnosis of CIN-1.  She is also having menopausal symptoms of hot flashes and skipping menstrual periods   Plan:            1.  Discussed the possibility of LEEP.  Literature given.  All questions answered.  We have also talked about the possibility of formal colposcopy.  If it continues to show CIN-2 then we would strongly consider LEEP.  The patient would like some time to consider her options and will inform us next week LEEP versus repeat colpo.  2.  FSH to check for menopause. Orders Orders Placed This Encounter  Procedures   . Follicle stimulating hormone    No orders of the defined types were placed in this encounter.     F/U  Return in about 6 months (around 04/18/2021). I spent 21 minutes involved in the care of this patient preparing to see the patient by obtaining and reviewing her medical history (including labs, imaging tests and prior procedures), documenting clinical information in the electronic health record (EHR), counseling and coordinating care plans, writing and sending prescriptions, ordering tests or procedures and directly communicating with the patient by discussing pertinent items from her history and physical exam as well as detailing my assessment and plan as noted above so that she has an informed understanding.  All of her questions were answered.  Finis Bud, M.D. 10/16/2020 3:44 PM

## 2020-10-16 NOTE — Patient Instructions (Signed)
Loop Electrosurgical Excision Procedure Loop electrosurgical excision procedure (LEEP) is the cutting and removal (excision) of tissue from the cervix. The cervix is the bottom part of the uterus that opens into the vagina. The tissue that is removed from the cervix is examined to see if there are precancerous cells or cancer cells present. LEEP may be done when:  You have abnormal bleeding from your cervix.  You have an abnormal Pap test result.  Your health care provider finds an abnormality on your cervix during a pelvic exam. LEEP typically only takes a few minutes and is often done in the health care provider's office. The procedure is safe for women who are trying to get pregnant. However, the procedure is usually not done during a menstrual period or during pregnancy. Tell a health care provider about:  Any allergies you have.  All medicines you are taking, including vitamins, herbs, eye drops, creams, and over-the-counter medicines.  Any blood disorders you have.  Any medical conditions you have, including current or past vaginal infections such as herpes or sexually-transmitted infections (STIs).  Whether you are pregnant or may be pregnant.  Whether or not you are having vaginal bleeding on the day of the procedure. What are the risks? Generally, this is a safe procedure. However, problems may occur, including:  Infection.  Bleeding.  Allergic reactions to medicines.  Changes or scarring in the cervix.  Increased risk of early (preterm) labor in future pregnancies. What happens before the procedure?  Ask your health care provider about: ? Changing or stopping your regular medicines. This is especially important if you are taking diabetes medicines or blood thinners. ? Taking medicines such as aspirin and ibuprofen. These medicines can thin your blood. Do not take these medicines unless your health care provider tells you to take them. ? Taking over-the-counter  medicines, vitamins, herbs, and supplements.  Your health care provider may recommend that you take pain medicine before the procedure.  Ask your health care provider if you should plan to have someone take you home after the procedure. What happens during the procedure?  An instrument called a speculum will be placed in your vagina. This will allow your health care provider to see your cervix.  You will be given a medicine to numb the area (local anesthetic). The medicine will be injected into your cervix and the surrounding area.  A solution will be applied to your cervix. This solution will help the health care provider find the abnormal cells that need to be removed.  A thin wire loop will be passed through your vagina. The wire will be used to burn (cauterize) the cervical tissue with an electrical current.  You may feel faint during the procedure. Tell your health care provider right away if you feel this way.  The abnormal cervical tissue will be removed.  Any open blood vessels will be cauterized to prevent bleeding.  A paste may be applied to the cauterized area of your cervix to help prevent bleeding.  The sample of cervical tissue will be examined under a microscope. The procedure may vary among health care providers and hospitals.   What can I expect after the procedure? After the procedure, it is common to have:  Mild abdominal cramps that are similar to menstrual cramps. These may last for up to 1 week.  A small amount of pink-tinged or bloody vaginal discharge, including light to moderate bleeding, for 1-2 weeks.  A dark-colored discharge coming from your vagina. This is  from the paste that was used on the cervix to prevent bleeding. It is up to you to get the results of your procedure. Ask your health care provider, or the department that is doing the procedure, when your results will be ready. Follow these instructions at home:  Take over-the-counter and  prescription medicines only as told by your health care provider.  Return to your normal activities as told by your health care provider. Ask your health care provider what activities are safe for you.  Do not put anything in your vagina for 2 weeks after the procedure or until your health care provider says that it is okay. This includes tampons, creams, and douches.  Do not have sex until your health care provider approves.  Keep all follow-up visits as told by your health care provider. This is important. Contact a health care provider if you:  Have a fever or chills.  Feel unusually weak.  Have vaginal bleeding that is heavier or longer than a normal menstrual cycle. A sign of this can be soaking a pad with blood or bleeding with clots.  Develop a bad smelling vaginal discharge.  Have severe abdominal pain or cramping. Summary  Loop electrosurgical excision procedure (LEEP) is the removal of tissue from the cervix. The removed tissue will be checked for precancerous cells or cancer cells.  LEEP typically only takes a few minutes and is often done in the health care provider's office.  Do not put anything in your vagina for 2 weeks after the procedure or until your health care provider says that it is okay. This includes tampons, creams, and douches.  Keep all follow-up visits as told by your health care provider. Ask your health care provider, or the department that is doing the procedure, when your results will be ready. This information is not intended to replace advice given to you by your health care provider. Make sure you discuss any questions you have with your health care provider. Document Revised: 06/16/2018 Document Reviewed: 06/16/2018 Elsevier Patient Education  2021 [employer].

## 2020-10-17 LAB — FOLLICLE STIMULATING HORMONE: FSH: 106 m[IU]/mL

## 2020-10-20 ENCOUNTER — Encounter: Payer: Self-pay | Admitting: Podiatry

## 2020-10-22 ENCOUNTER — Encounter: Payer: Self-pay | Admitting: Obstetrics and Gynecology

## 2020-10-22 ENCOUNTER — Other Ambulatory Visit: Payer: Self-pay

## 2020-10-22 ENCOUNTER — Ambulatory Visit (INDEPENDENT_AMBULATORY_CARE_PROVIDER_SITE_OTHER): Payer: BC Managed Care – PPO | Admitting: Obstetrics and Gynecology

## 2020-10-22 VITALS — BP 113/77 | HR 82 | Ht 64.0 in | Wt 162.0 lb

## 2020-10-22 DIAGNOSIS — N951 Menopausal and female climacteric states: Secondary | ICD-10-CM | POA: Diagnosis not present

## 2020-10-22 DIAGNOSIS — N871 Moderate cervical dysplasia: Secondary | ICD-10-CM

## 2020-10-22 MED ORDER — ESTRADIOL-NORETHINDRONE ACET 1-0.5 MG PO TABS: 1.0000 | ORAL_TABLET | Freq: Every day | ORAL | 1 refills | Status: DC

## 2020-10-22 NOTE — Progress Notes (Signed)
HPI:      [name] is a 50 y.o. [phone] who LMP was No LMP recorded. (Menstrual status: Perimenopausal).  Subjective:   She presents today because she has been having menopausal symptoms and missing menses.  She recently had an [facility] performed which reveals that she is in menopause. 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    Hx: The following portions of the patient's history were reviewed and updated as appropriate:             She  has a past medical history of ASCUS with positive high risk HPV cervical (08/04/2016). She does not have any pertinent problems on file. She  has a past surgical history that includes Colposcopy and Tubal ligation. Her family history is not on file. She was adopted. She  reports that she has never smoked. She has never used smokeless tobacco. She reports that she does not drink alcohol and does not use drugs. She has a current medication list which includes the following prescription(s): estradiol-norethindrone and meloxicam. She has No Known Allergies.       Review of Systems:  Review of Systems  Constitutional: Denied constitutional symptoms, night sweats, recent illness, fatigue, fever, insomnia and weight loss.  Eyes: Denied eye symptoms, eye pain, photophobia, vision change and visual disturbance.  Ears/Nose/Throat/Neck: Denied ear, nose, throat or neck symptoms, hearing loss, nasal discharge, sinus congestion and sore throat.  Cardiovascular: Denied cardiovascular symptoms, arrhythmia, chest pain/pressure, edema, exercise intolerance, orthopnea and palpitations.  Respiratory: Denied pulmonary symptoms, asthma, pleuritic pain, productive sputum, cough, dyspnea and wheezing.  Gastrointestinal: Denied, gastro-esophageal reflux, melena, nausea and vomiting.  Genitourinary: Denied genitourinary symptoms including symptomatic vaginal discharge, pelvic relaxation issues, and urinary complaints.   Musculoskeletal: Denied musculoskeletal symptoms, stiffness, swelling, muscle weakness and myalgia.  Dermatologic: Denied dermatology symptoms, rash and scar.  Neurologic: Denied neurology symptoms, dizziness, headache, neck pain and syncope.  Psychiatric: Denied psychiatric symptoms, anxiety and depression.  Endocrine: See HPI for additional information.   Meds:   Current Outpatient Medications on File Prior to Visit  Medication Sig Dispense Refill  . meloxicam (MOBIC) 15 MG tablet Take 1 tablet (15 mg total) by mouth daily. (Patient not taking: Reported on 10/22/2020) 30 tablet 2   No current facility-administered medications on file prior to visit.          Objective:     Vitals:   10/22/20 1534  BP: 113/77  Pulse: 82   Filed Weights   10/22/20 1534  Weight: 162 lb (73.5 kg)                Assessment:    [id number] Patient Active Problem List   Diagnosis Date Noted  . Heavy periods 07/18/2017  . Abnormal breast finding 03/10/2017  . ASCUS with positive high risk HPV cervical 08/04/2016  . Irregular periods 10/30/2015  . Fatigue 10/30/2015     1. Symptomatic menopausal or female climacteric states   2. CIN II (cervical intraepithelial neoplasia II)     FSH shows menopause. -Patient is symptomatic.   Plan:            1.  HRT I have discussed HRT with the patient in detail.  The risk/benefits of it were reviewed.  She understands that during menopause Estrogen decreases dramatically and that this results in an increased risk of cardiovascular disease as well as osteoporosis.  We have also discussed the fact that hot flashes  often result from a decrease in Estrogen, and that by replacing Estrogen, they can often be alleviated.  We have discussed skin, vaginal and urinary tract changes that may also take place from this drop in Estrogen.  Emotional changes have also been linked to Estrogen and we have briefly discussed this.  The benefits of HRT including decrease  in hot flashes, vaginal dryness, and osteoporosis were discussed.  The emotional benefit and a possible change in her cardiovascular risk profile was also reviewed.  The risks associated with Hormone Replacement Therapy were also reviewed.  The use of unopposed Estrogen and its relationship to endometrial cancer was discussed.  The addition of Progesterone and its beneficial effect on endometrial cancer was also noted.  The fact that there has been no consistent definitive studies showing an increase in breast cancer in women who use HRT was discussed with the patient.  The possible side effects including breast tenderness, fluid retention, mood changes and vaginal bleeding were discussed.  The patient was informed that this is an elective medication and that she may choose not to take Hormone Replacement Therapy.  Literature on HRT was made available, and I believe that after answering all of the patient's questions.  Special emphasis on the WHI study, as well as several studies since that pertaining to the risks and benefits of estrogen replacement therapy were compared.  The possible limitations of these studies were discussed including the age stratification of the WHI study.  The possible increased role of Progesterone in these studies was discussed in detail.  Different types of hormone formulation and methods of taking hormone replacement therapy were discussed. Literature on HRT was made available, and I believe that after answering all of the patient's questions she has an adequate and informed understanding of the risks and benefits of HRT. She has chosen to begin [employer].  We will follow-up in 3 months for ERT follow-up. 2.  Instead of a follow-up colposcopy in 6 months she has chosen to have her CIN-2 treated by LEEP.  She will schedule this in the near future.  Orders No orders of the defined types were placed in this encounter.    Meds ordered this encounter  Medications  .  estradiol-norethindrone (ACTIVELLA) 1-0.5 MG tablet    Sig: Take 1 tablet by mouth daily.    Dispense:  30 tablet    Refill:  1      F/U  Return in about 3 months (around 01/22/2021). I spent 23 minutes involved in the care of this patient preparing to see the patient by obtaining and reviewing her medical history (including labs, imaging tests and prior procedures), documenting clinical information in the electronic health record (EHR), counseling and coordinating care plans, writing and sending prescriptions, ordering tests or procedures and directly communicating with the patient by discussing pertinent items from her history and physical exam as well as detailing my assessment and plan as noted above so that she has an informed understanding.  All of her questions were answered.  Finis Bud, M.D. 10/22/2020 3:46 PM

## 2020-11-01 IMAGING — MG DIGITAL DIAGNOSTIC BILAT W/ TOMO W/ CAD
8 series · 9 of 24 positions shown · non-contrast
Comparison: Previous exam(s).

CLINICAL DATA: Follow-up probably benign left breast mass.

EXAM:
DIGITAL DIAGNOSTIC BILATERAL MAMMOGRAM WITH CAD AND TOMO
ULTRASOUND LEFT BREAST

[R CC synth-2D]
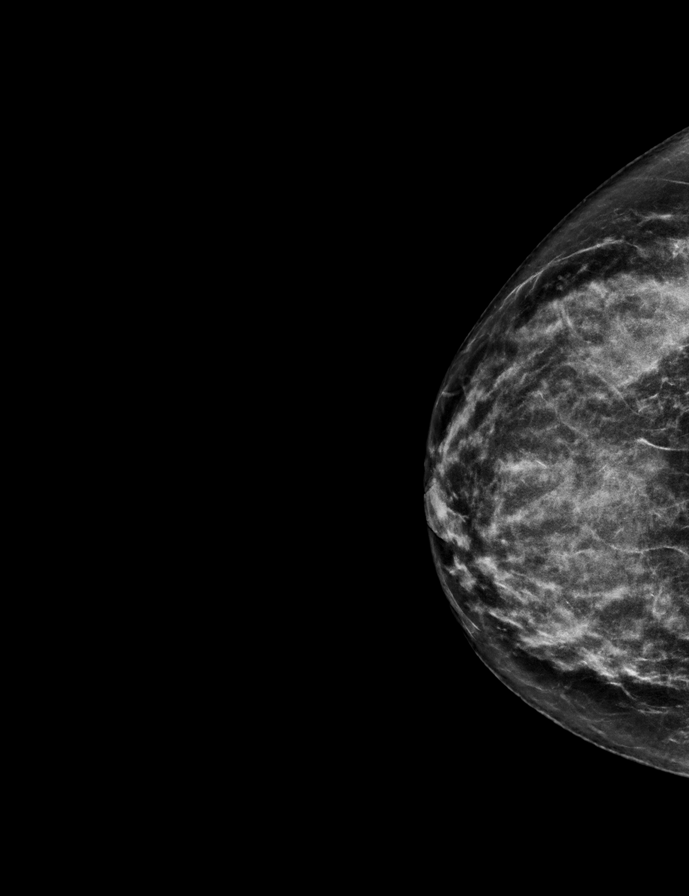

[R MLO synth-2D]
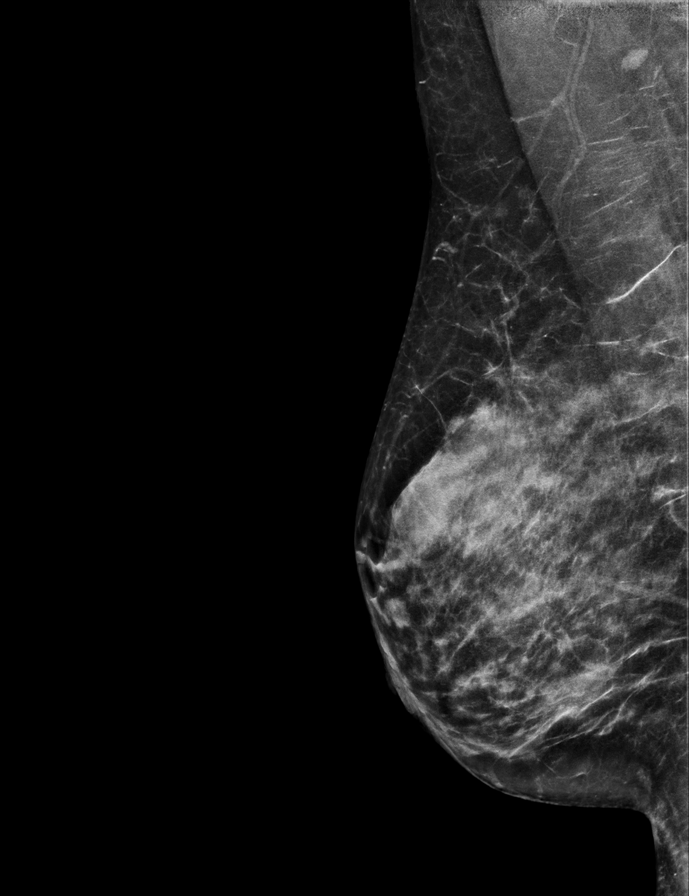

[L MLO synth-2D]
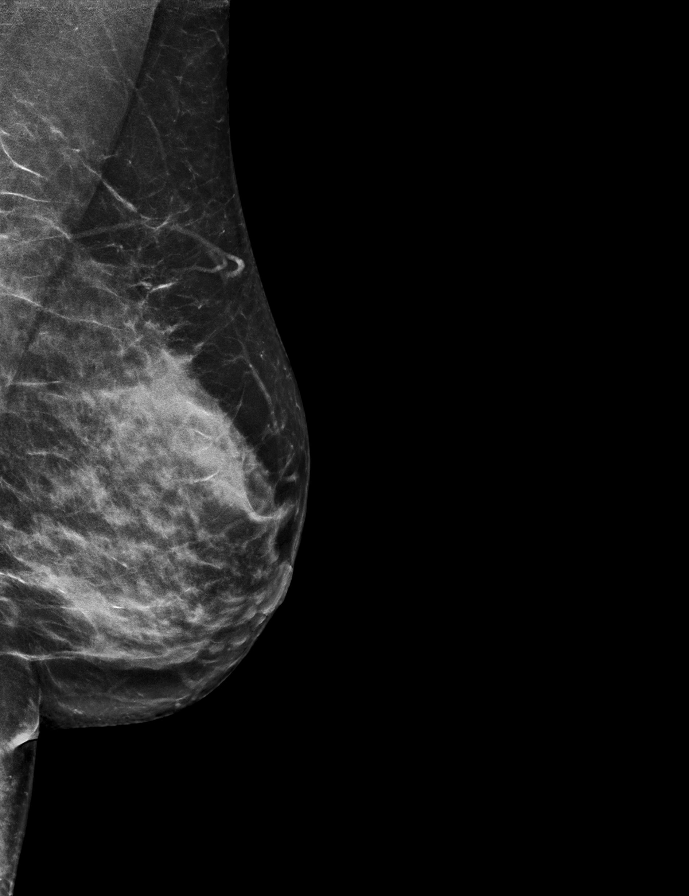

[L CC synth-2D]
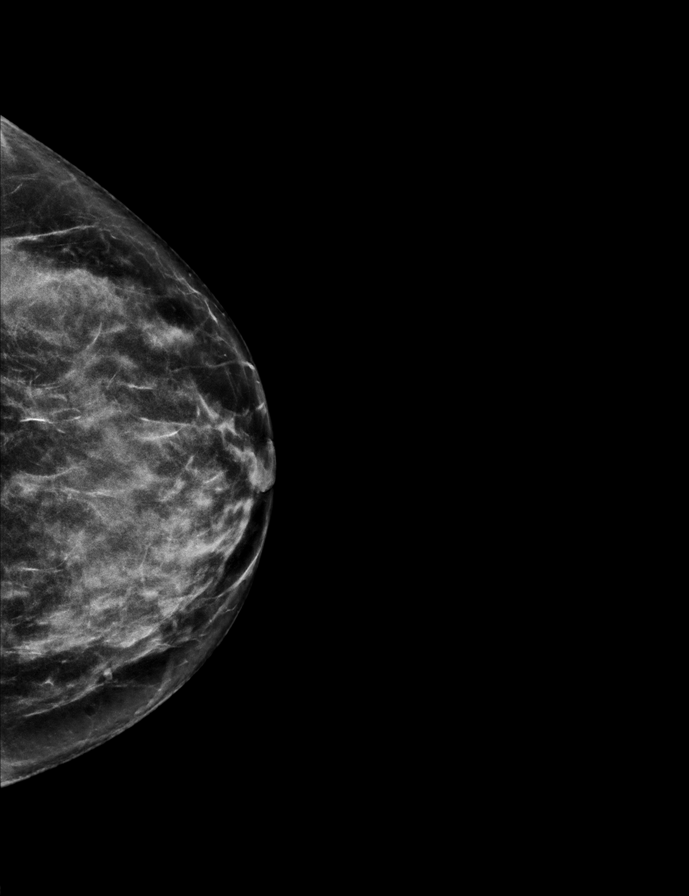

[L MLO tomo · 2 of 61 frames shown]
[frame 20/61]
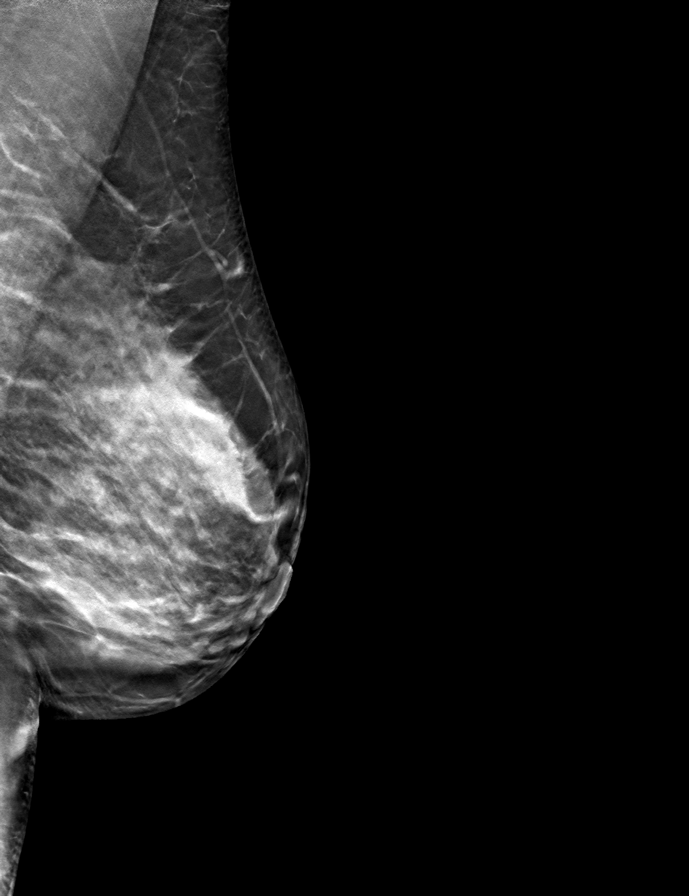
[frame 31/61]
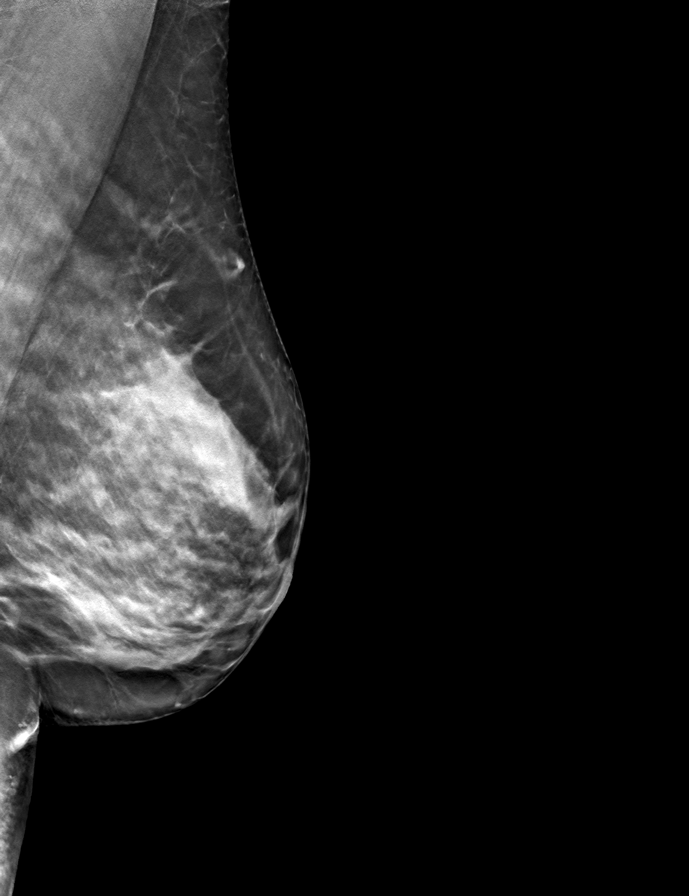

[R MLO tomo · tomo slice 29/57.0]
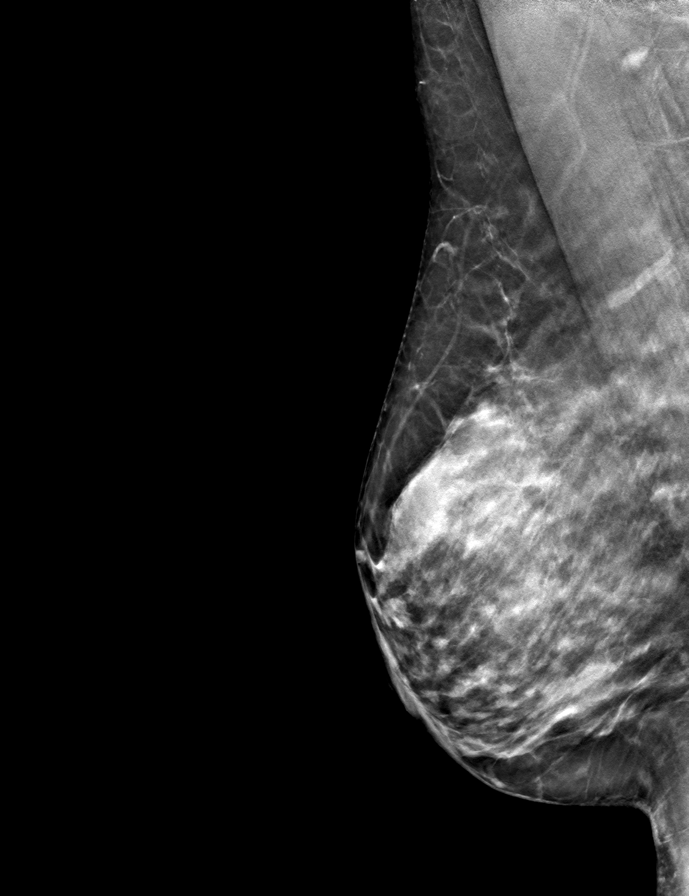

[R CC tomo · tomo slice 28/55.0]
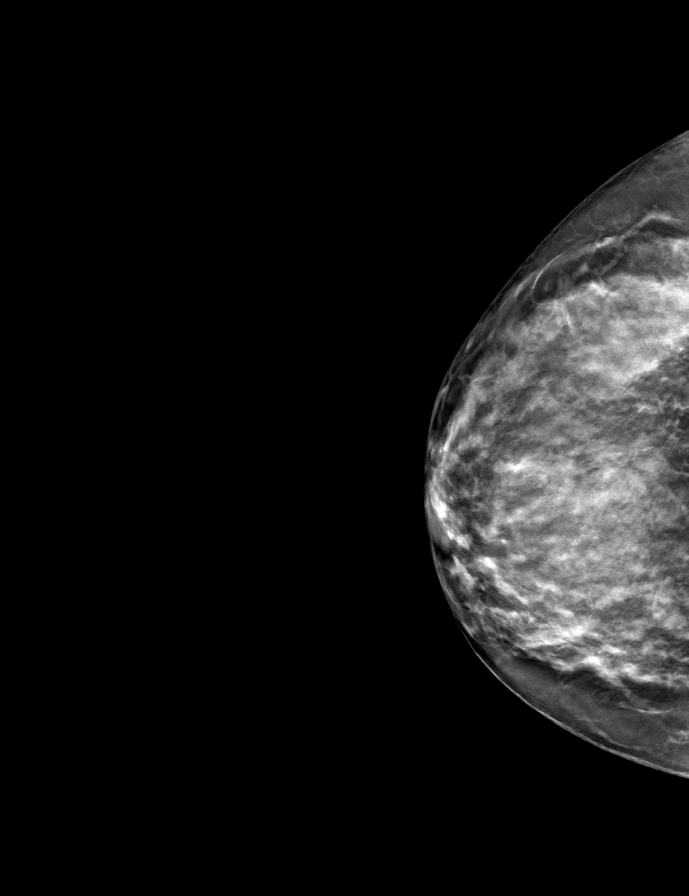

[L CC tomo · tomo slice 29/56.0]
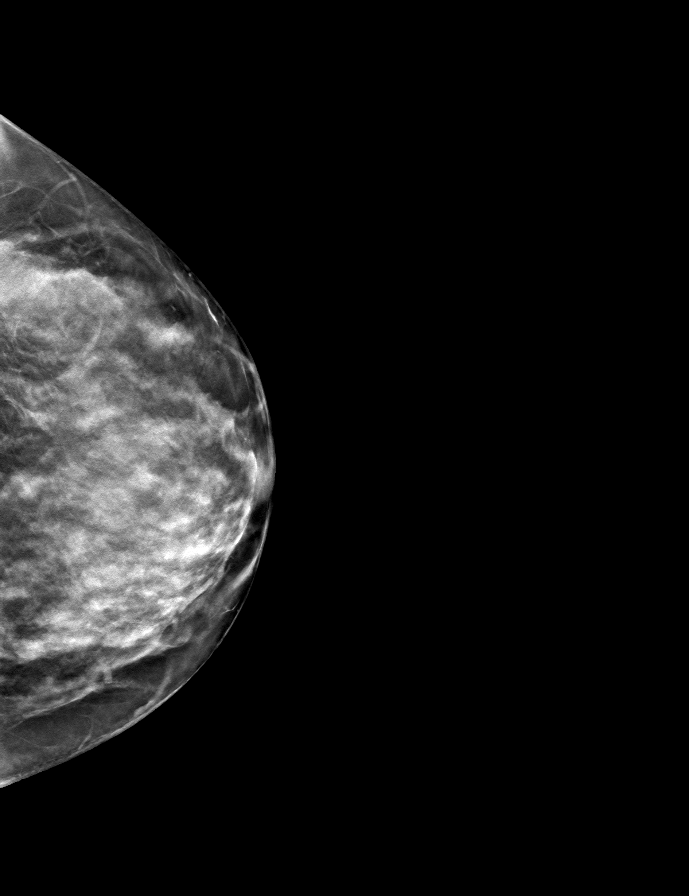

[9 of 24 positions shown; findings below may reference images not displayed]

ACR Breast Density Category c: The breast tissue is heterogeneously
dense, which may obscure small masses.
FINDINGS: The previously demonstrated oval, circumscribed mass in the lower,
slightly inner left breast has not changed significantly in size and
shape.

Mammographic images were processed with CAD.

Targeted ultrasound is performed, showing a 5 x 4 x 3 mm oval cyst
with low-level internal echoes in the 7 o'clock position of the left
breast, 3 cm from the nipple. This has fewer internal echoes than
seen initially on 09/20/2016 and is slightly larger. There are
additional smaller, similar-appearing cysts in that region of the
breast. No solid masses were seen.
IMPRESSION: 1. Benign left breast cysts.
2. No evidence of malignancy in either breast.

RECOMMENDATION:
Bilateral screening mammogram in 1 year.

I have discussed the findings and recommendations with the patient.
If applicable, a reminder letter will be sent to the patient
regarding the next appointment.

BI-RADS CATEGORY  2: Benign.

## 2020-11-13 ENCOUNTER — Other Ambulatory Visit: Payer: Self-pay

## 2020-11-13 ENCOUNTER — Encounter: Payer: Self-pay | Admitting: Obstetrics and Gynecology

## 2020-11-13 ENCOUNTER — Ambulatory Visit (INDEPENDENT_AMBULATORY_CARE_PROVIDER_SITE_OTHER): Payer: BC Managed Care – PPO | Admitting: Obstetrics and Gynecology

## 2020-11-13 ENCOUNTER — Other Ambulatory Visit (HOSPITAL_COMMUNITY)
Admission: RE | Admit: 2020-11-13 | Discharge: 2020-11-13 | Disposition: A | Discharge status: Home / Self Care | Payer: BC Managed Care – PPO | Source: Ambulatory Visit | Attending: Obstetrics and Gynecology | Admitting: Obstetrics and Gynecology

## 2020-11-13 VITALS — BP 114/74 | HR 70 | Ht 64.0 in | Wt 159.1 lb

## 2020-11-13 DIAGNOSIS — N870 Mild cervical dysplasia: Secondary | ICD-10-CM | POA: Diagnosis not present

## 2020-11-13 DIAGNOSIS — N871 Moderate cervical dysplasia: Secondary | ICD-10-CM | POA: Diagnosis not present

## 2020-11-13 NOTE — Addendum Note (Signed)
Addended by: Durwin Glaze on: 11/13/2020 11:47 AM   Modules accepted: Orders

## 2020-11-13 NOTE — Progress Notes (Signed)
HPI:      [name] is a 50 y.o. [phone] who LMP was No LMP recorded. (Menstrual status: Perimenopausal).  Subjective:   She presents today for LEEP.  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 Patient recently begun on HRT.  Doing well on it currently.    Hx: The following portions of the patient's history were reviewed and updated as appropriate:             She  has a past medical history of ASCUS with positive high risk HPV cervical (08/04/2016). She does not have any pertinent problems on file. She  has a past surgical history that includes Colposcopy and Tubal ligation. Her family history is not on file. She was adopted. She  reports that she has never smoked. She has never used smokeless tobacco. She reports that she does not drink alcohol and does not use drugs. She has a current medication list which includes the following prescription(s): estradiol-norethindrone. She has No Known Allergies.       Review of Systems:  Review of Systems  Constitutional: Denied constitutional symptoms, night sweats, recent illness, fatigue, fever, insomnia and weight loss.  Eyes: Denied eye symptoms, eye pain, photophobia, vision change and visual disturbance.  Ears/Nose/Throat/Neck: Denied ear, nose, throat or neck symptoms, hearing loss, nasal discharge, sinus congestion and sore throat.  Cardiovascular: Denied cardiovascular symptoms, arrhythmia, chest pain/pressure, edema, exercise intolerance, orthopnea and palpitations.  Respiratory: Denied pulmonary symptoms, asthma, pleuritic pain, productive sputum, cough, dyspnea and wheezing.  Gastrointestinal: Denied, gastro-esophageal reflux, melena, nausea and vomiting.  Genitourinary: Denied genitourinary symptoms including symptomatic vaginal discharge, pelvic relaxation issues, and urinary complaints.  Musculoskeletal: Denied musculoskeletal symptoms, stiffness, swelling, muscle weakness and myalgia.   Dermatologic: Denied dermatology symptoms, rash and scar.  Neurologic: Denied neurology symptoms, dizziness, headache, neck pain and syncope.  Psychiatric: Denied psychiatric symptoms, anxiety and depression.  Endocrine: Denied endocrine symptoms including hot flashes and night sweats.   Meds:   Current Outpatient Medications on File Prior to Visit  Medication Sig Dispense Refill   estradiol-norethindrone (ACTIVELLA) 1-0.5 MG tablet Take 1 tablet by mouth daily. 30 tablet 1   No current facility-administered medications on file prior to visit.         Objective:     Vitals:   11/13/20 1115  BP: 114/74  Pulse: 70   Filed Weights   11/13/20 1115  Weight: 159 lb 1.6 oz (72.2 kg)              LEEP The LEEP has been explained to the patient in detail; risks/benefits reviewed.  The risks include, but are not limited to, bleeding, infection, and the possibility of cervical stenosis or cervical incompetence.  The patient had previously been given information regarding abnormal PAP smears and their relationship to HPV.  We have discussed the natural course and history of HPV, the possibility of incomplete treatment by the LEEP, as well as the possibility of recurrence.  I have reviewed the consent form for LEEP with her, and she fully understands its contents.  We have discussed the procedure itself. I have informed her that following the LEEP she should refrain from intercourse and the use of tampons for three weeks, and that she should also expect some spotting and brown/black discharge over the next several days.  We have discussed the fact that vaginal bleeding, differentiated from spotting, is not normal and that if she should have this complication, she should contact  me immediately.  The follow-up after LEEP will be PAP smears or viral typing performed at regular intervals for up to 3 years.  Should these all prove to be normal, she will then be back on typical cervical screening.  I  have answered all of her questions, and I believe she has an adequate understanding of the LEEP, its implications, and the necessity of follow-up care.  LEEP PROCEDURE NOTE  I again discussed her colpo results and explained the procedure of LEEP.  All questions were answered and she signed the consent form.  LEEP performed in the usual manner after reviewing the previous colpo findings and results. Lugol's solution was used to identify any abnormal areas of the cervix.  These were compared with the previous colpo pictures. The cervix was cleansed with betadine solution. Local injection of Lidocaine was performed for anesthesia. E[place]ka current was used to remove the specimen.  It was labeled accordingly. The base and edges of the defect was then cauterized using coagulation current. Monsel's solution was then applied in the usual manner.   Assessment:    [id number] Patient Active Problem List   Diagnosis Date Noted   Heavy periods 07/18/2017   Abnormal breast finding 03/10/2017   ASCUS with positive high risk HPV cervical 08/04/2016   Irregular periods 10/30/2015   Fatigue 10/30/2015     1. CIN II (cervical intraepithelial neoplasia II)        Plan:            1.  Follow-up in 4 weeks for LEEP follow-up  2.  Discuss HRT again at next visit. Orders No orders of the defined types were placed in this encounter.   No orders of the defined types were placed in this encounter.     F/U  Return in about 4 weeks (around 12/11/2020). I spent 21 minutes involved in the care of this patient preparing to see the patient by obtaining and reviewing her medical history (including labs, imaging tests and prior procedures), documenting clinical information in the electronic health record (EHR), counseling and coordinating care plans, writing and sending prescriptions, ordering tests or procedures and directly communicating with the patient by discussing pertinent items from her history and  physical exam as well as detailing my assessment and plan as noted above so that she has an informed understanding.  All of her questions were answered.  Finis Bud, M.D. 11/13/2020 11:17 AM

## 2020-11-18 LAB — SURGICAL PATHOLOGY

## 2020-11-24 ENCOUNTER — Encounter: Payer: Self-pay | Admitting: Family Medicine

## 2020-11-25 ENCOUNTER — Telehealth: Payer: Self-pay | Admitting: Obstetrics and Gynecology

## 2020-11-25 NOTE — Telephone Encounter (Signed)
Patient called about a mychart message sent to Dr.Evans/nurse. I did see where Dr.Evans responded I told pt in the message Dr.Evans states it is normal to have some bleeding. Pt has questions for nurse. Please Advise.

## 2020-11-27 NOTE — Telephone Encounter (Signed)
Pt called no answer LM via VM to please contact the office to speak more about her concerns.

## 2020-11-28 NOTE — Telephone Encounter (Signed)
Please see my chart messages

## 2020-12-11 ENCOUNTER — Other Ambulatory Visit: Payer: Self-pay

## 2020-12-11 ENCOUNTER — Ambulatory Visit (INDEPENDENT_AMBULATORY_CARE_PROVIDER_SITE_OTHER): Payer: BC Managed Care – PPO | Admitting: Obstetrics and Gynecology

## 2020-12-11 ENCOUNTER — Encounter: Payer: Self-pay | Admitting: Obstetrics and Gynecology

## 2020-12-11 VITALS — BP 108/73 | HR 76 | Ht 64.0 in | Wt 163.4 lb

## 2020-12-11 DIAGNOSIS — Z9889 Other specified postprocedural states: Secondary | ICD-10-CM

## 2020-12-11 DIAGNOSIS — N871 Moderate cervical dysplasia: Secondary | ICD-10-CM

## 2020-12-11 NOTE — Progress Notes (Signed)
HPI:      Ms. Cathrine JELISA [place] is a 50 y.o. [phone] who LMP was No LMP recorded (lmp unknown). (Menstrual status: Perimenopausal).  Subjective:   She presents today 1 month after LEEP for CIN-2 persistent.  She reports she is doing well.  She had some spotting.  She has resumed intercourse without problem.    Hx: The following portions of the patient's history were reviewed and updated as appropriate:             She  has a past medical history of ASCUS with positive high risk HPV cervical (08/04/2016). She does not have any pertinent problems on file. She  has a past surgical history that includes Colposcopy and Tubal ligation. Her family history is not on file. She was adopted. She  reports that she has never smoked. She has never used smokeless tobacco. She reports that she does not drink alcohol and does not use drugs. She has a current medication list which includes the following prescription(s): estradiol-norethindrone. She has No Known Allergies.       Review of Systems:  Review of Systems  Constitutional: Denied constitutional symptoms, night sweats, recent illness, fatigue, fever, insomnia and weight loss.  Eyes: Denied eye symptoms, eye pain, photophobia, vision change and visual disturbance.  Ears/Nose/Throat/Neck: Denied ear, nose, throat or neck symptoms, hearing loss, nasal discharge, sinus congestion and sore throat.  Cardiovascular: Denied cardiovascular symptoms, arrhythmia, chest pain/pressure, edema, exercise intolerance, orthopnea and palpitations.  Respiratory: Denied pulmonary symptoms, asthma, pleuritic pain, productive sputum, cough, dyspnea and wheezing.  Gastrointestinal: Denied, gastro-esophageal reflux, melena, nausea and vomiting.  Genitourinary: Denied genitourinary symptoms including symptomatic vaginal discharge, pelvic relaxation issues, and urinary complaints.  Musculoskeletal: Denied musculoskeletal symptoms, stiffness, swelling, muscle weakness and myalgia.   Dermatologic: Denied dermatology symptoms, rash and scar.  Neurologic: Denied neurology symptoms, dizziness, headache, neck pain and syncope.  Psychiatric: Denied psychiatric symptoms, anxiety and depression.  Endocrine: Denied endocrine symptoms including hot flashes and night sweats.   Meds:   Current Outpatient Medications on File Prior to Visit  Medication Sig Dispense Refill   estradiol-norethindrone (ACTIVELLA) 1-0.5 MG tablet Take 1 tablet by mouth daily. 30 tablet 1   No current facility-administered medications on file prior to visit.          Objective:     Vitals:   12/11/20 1550  BP: 108/73  Pulse: 76   Filed Weights   12/11/20 1550  Weight: 163 lb 6.4 oz (74.1 kg)              Physical examination   Pelvic:   Vulva: Normal appearance.  No lesions.  Vagina: No lesions or abnormalities noted.  Support: Normal pelvic support.  Urethra No masses tenderness or scarring.  Meatus Normal size without lesions or prolapse.  Cervix: Normal appearance.  No lesions.  Obviously status post LEEP  Anus: Normal exam.  No lesions.  Perineum: Normal exam.  No lesions.        Bimanual   Uterus: Normal size.  Non-tender.  Mobile.  AV.  Adnexae: No masses.  Non-tender to palpation.  Cul-de-sac: Negative for abnormality.     Assessment:    [id number] Patient Active Problem List   Diagnosis Date Noted   Heavy periods 07/18/2017   Abnormal breast finding 03/10/2017   ASCUS with positive high risk HPV cervical 08/04/2016   Irregular periods 10/30/2015   Fatigue 10/30/2015     1. CIN II (cervical intraepithelial neoplasia II)   2.  History of loop electrical excision procedure (LEEP)        Plan:            1.  Plan first Pap after LEEP 3 months from now. Orders No orders of the defined types were placed in this encounter.   No orders of the defined types were placed in this encounter.     F/U  Return in about 3 months (around 03/13/2021). I spent 21  minutes involved in the care of this patient preparing to see the patient by obtaining and reviewing her medical history (including labs, imaging tests and prior procedures), documenting clinical information in the electronic health record (EHR), counseling and coordinating care plans, writing and sending prescriptions, ordering tests or procedures and directly communicating with the patient by discussing pertinent items from her history and physical exam as well as detailing my assessment and plan as noted above so that she has an informed understanding.  All of her questions were answered.  Finis Bud, M.D. 12/11/2020 4:07 PM

## 2021-01-01 ENCOUNTER — Telehealth: Payer: BC Managed Care – PPO | Admitting: Physician Assistant

## 2021-01-01 DIAGNOSIS — R197 Diarrhea, unspecified: Secondary | ICD-10-CM | POA: Diagnosis not present

## 2021-01-01 DIAGNOSIS — R519 Headache, unspecified: Secondary | ICD-10-CM | POA: Diagnosis not present

## 2021-01-01 NOTE — Progress Notes (Signed)
E-Visit for Corona Virus Screening  Your current symptoms could be consistent with the coronavirus.  Many health care providers can now test patients at their office but not all are.  [place] has multiple testing sites. For information on our [place] testing locations and hours go to [email or web address]  We are enrolling you in our [place] for [place] . Daily you will receive a questionnaire within the [place] website. Our COVID 19 response team will be monitoring your responses daily.  Testing Information: The COVID-19 Community Testing sites are testing BY APPOINTMENT ONLY.  You can schedule online at [email or web address]  If you do not have access to a smart phone or computer you may call [phone] for an appointment.   Additional testing sites in the Community:  For [facility] Testing sites in [place]  FaceUpdate.[email or web address]  For Pop-up testing sites in [place]  [email or web address]  For [facility] [email or web address]  For [facility] testing in [place] and [employer] [email or web address]  For [facility] testing in [place]   [email or web address]  For  more information about community testing call [phone]   Please quarantine yourself while awaiting your test results. Please stay home for a minimum of 10 days from the first day of illness with improving symptoms and you have had 24 hours of no fever (without the use of Tylenol (Acetaminophen) Motrin (Ibuprofen) or any fever reducing medication).  Also - Do not get tested prior to returning to work because once you have had a positive test the test can stay positive for  more than a month in some cases.   You should wear a mask or cloth face covering over your nose and mouth if you must be around other people or animals, including pets (even at home). Try to stay at least 6 feet away from other people. This will protect the people around you.  Please continue good preventive care measures, including:  frequent hand-washing, avoid touching your face, cover coughs/sneezes, stay out of crowds and keep a 6 foot distance from others.  COVID-19 is a respiratory illness with symptoms that are similar to the flu. Symptoms are typically mild to moderate, but there have been cases of severe illness and death due to the virus.   The following symptoms may appear 2-14 days after exposure: Fever Cough Shortness of breath or difficulty breathing Chills Repeated shaking with chills Muscle pain Headache Sore throat New loss of taste or smell Fatigue Congestion or runny nose Nausea or vomiting Diarrhea  Go to the nearest hospital ED for assessment if fever/cough/breathlessness are severe or illness seems like a threat to life.  It is vitally important that if you feel that you have an infection such as this virus or any other virus that you stay home and away from places where you may spread it to others.  You should avoid contact with people age 47 and older.    You may also take acetaminophen (Tylenol) and Motrin as needed for fever.  You should stick to the BRAT diet to help your symptoms of diarrhea.  B- bananas R- rice A- applesauce T- toast  Reduce your risk of any infection by using the same precautions used for avoiding the common cold or flu:  Wash your hands often with soap and warm water for at least 20 seconds.  If soap and water are not readily available, use an alcohol-based hand sanitizer with at least 60% alcohol.  If coughing or sneezing, cover your mouth and  nose by coughing or sneezing into the elbow areas of your shirt or coat, into a tissue or  into your sleeve (not your hands). Avoid shaking hands with others and consider head nods or verbal greetings only. Avoid touching your eyes, nose, or mouth with unwashed hands.  Avoid close contact with people who are sick. Avoid places or events with large numbers of people in one location, like concerts or sporting events. Carefully consider travel plans you have or are making. If you are planning any travel outside or inside the [place], visit the CDC's Travelers' Health webpage for the latest health notices. If you have some symptoms but not all symptoms, continue to monitor at home and seek medical attention if your symptoms worsen. If you are having a medical emergency, call 911.  HOME CARE Only take medications as instructed by your medical team. Drink plenty of fluids and get plenty of rest. A steam or ultrasonic humidifier can help if you have congestion.   GET HELP RIGHT AWAY IF YOU HAVE EMERGENCY WARNING SIGNS** FOR COVID-19. If you or someone is showing any of these signs seek emergency medical care immediately. Call 911 or proceed to your closest emergency facility if: You develop worsening high fever. Trouble breathing Bluish lips or face Persistent pain or pressure in the chest New confusion Inability to wake or stay awake You cough up blood. Your symptoms become more severe  **This list is not all possible symptoms. Contact your medical provider for any symptoms that are sever or concerning to you.  MAKE SURE YOU  Understand these instructions. Will watch your condition. Will get help right away if you are not doing well or get worse.  Your e-visit answers were reviewed by a board certified advanced clinical practitioner to complete your personal care plan.  Depending on the condition, your plan could have included both over the counter or prescription medications.  If there is a problem please reply once you have received a response from your provider.  Your safety is  important to [place].  If you have drug allergies check your prescription carefully.    You can use MyChart to ask questions about today's visit, request a non-urgent call back, or ask for a work or school excuse for 24 hours related to this e-Visit. If it has been greater than 24 hours you will need to follow up with your provider, or enter a new e-Visit to address those concerns. You will get an e-mail in the next two days asking about your experience.  I hope that your e-visit has been valuable and will speed your recovery. Thank you for using e-visits.  Approximately 5 minutes was spent documenting and reviewing patient's chart.

## 2021-01-22 ENCOUNTER — Encounter: Payer: BC Managed Care – PPO | Admitting: Obstetrics and Gynecology

## 2021-01-23 ENCOUNTER — Encounter: Payer: BC Managed Care – PPO | Admitting: Obstetrics and Gynecology

## 2021-03-17 ENCOUNTER — Other Ambulatory Visit: Payer: Self-pay

## 2021-03-17 ENCOUNTER — Other Ambulatory Visit (HOSPITAL_COMMUNITY)
Admission: RE | Admit: 2021-03-17 | Discharge: 2021-03-17 | Disposition: A | Discharge status: Home / Self Care | Payer: BC Managed Care – PPO | Source: Ambulatory Visit | Attending: Obstetrics and Gynecology | Admitting: Obstetrics and Gynecology

## 2021-03-17 ENCOUNTER — Encounter: Payer: Self-pay | Admitting: Obstetrics and Gynecology

## 2021-03-17 ENCOUNTER — Ambulatory Visit (INDEPENDENT_AMBULATORY_CARE_PROVIDER_SITE_OTHER): Payer: BC Managed Care – PPO | Admitting: Obstetrics and Gynecology

## 2021-03-17 VITALS — BP 115/60 | HR 68 | Resp 16 | Ht 64.0 in | Wt 151.7 lb

## 2021-03-17 DIAGNOSIS — N871 Moderate cervical dysplasia: Secondary | ICD-10-CM | POA: Insufficient documentation

## 2021-03-17 DIAGNOSIS — Z9889 Other specified postprocedural states: Secondary | ICD-10-CM

## 2021-03-17 NOTE — Progress Notes (Signed)
HPI:      [name] is a 50 y.o. [phone] who LMP was Patient's last menstrual period was 03/12/2021 (exact date).  Subjective:   She presents today for her first Pap smear after LEEP.  She reports that she is doing well and has no issues.  She reports that she is not having any menopausal symptoms at this time and has decided not to take her HRT.  She would like to reevaluate this in June with her next visit.    Hx: The following portions of the patient's history were reviewed and updated as appropriate:             She  has a past medical history of ASCUS with positive high risk HPV cervical (08/04/2016). She does not have any pertinent problems on file. She  has a past surgical history that includes Colposcopy and Tubal ligation. Her family history is not on file. She was adopted. She  reports that she has never smoked. She has never used smokeless tobacco. She reports that she does not drink alcohol and does not use drugs. She has a current medication list which includes the following prescription(s): estradiol-norethindrone. She has No Known Allergies.       Review of Systems:  Review of Systems  Constitutional: Denied constitutional symptoms, night sweats, recent illness, fatigue, fever, insomnia and weight loss.  Eyes: Denied eye symptoms, eye pain, photophobia, vision change and visual disturbance.  Ears/Nose/Throat/Neck: Denied ear, nose, throat or neck symptoms, hearing loss, nasal discharge, sinus congestion and sore throat.  Cardiovascular: Denied cardiovascular symptoms, arrhythmia, chest pain/pressure, edema, exercise intolerance, orthopnea and palpitations.  Respiratory: Denied pulmonary symptoms, asthma, pleuritic pain, productive sputum, cough, dyspnea and wheezing.  Gastrointestinal: Denied, gastro-esophageal reflux, melena, nausea and vomiting.  Genitourinary: Denied genitourinary symptoms including symptomatic vaginal discharge, pelvic relaxation issues, and  urinary complaints.  Musculoskeletal: Denied musculoskeletal symptoms, stiffness, swelling, muscle weakness and myalgia.  Dermatologic: Denied dermatology symptoms, rash and scar.  Neurologic: Denied neurology symptoms, dizziness, headache, neck pain and syncope.  Psychiatric: Denied psychiatric symptoms, anxiety and depression.  Endocrine: Denied endocrine symptoms including hot flashes and night sweats.   Meds:   Current Outpatient Medications on File Prior to Visit  Medication Sig Dispense Refill   estradiol-norethindrone (ACTIVELLA) 1-0.5 MG tablet Take 1 tablet by mouth daily. 30 tablet 1   No current facility-administered medications on file prior to visit.      Objective:     Vitals:   03/17/21 0912  BP: 115/60  Pulse: 68  Resp: 16   Filed Weights   03/17/21 0912  Weight: 151 lb 11.2 oz (68.8 kg)              Physical examination   Pelvic:   Vulva: Normal appearance.  No lesions.  Vagina: No lesions or abnormalities noted.  Support: Normal pelvic support.  Urethra No masses tenderness or scarring.  Meatus Normal size without lesions or prolapse.  Cervix: Normal appearance.  No lesions.  Status post procedure  Anus: Normal exam.  No lesions.  Perineum: Normal exam.  No lesions.        Bimanual   Uterus: Normal size.  Non-tender.  Mobile.  AV.  Adnexae: No masses.  Non-tender to palpation.  Cul-de-sac: Negative for abnormality.             Assessment:    [id number] Patient Active Problem List   Diagnosis Date Noted   Heavy periods 07/18/2017   Abnormal breast finding  03/10/2017   ASCUS with positive high risk HPV cervical 08/04/2016   Irregular periods 10/30/2015   Fatigue 10/30/2015     1. History of loop electrical excision procedure (LEEP)   2. CIN II (cervical intraepithelial neoplasia II)        Plan:            1.  First Pap after LEEP in June  2.  Patient would like to reevaluate use of HRT in June.  Between now and then she has decided  not to take the pills. Orders No orders of the defined types were placed in this encounter.   No orders of the defined types were placed in this encounter.     F/U  No follow-ups on file. I spent 22 minutes involved in the care of this patient preparing to see the patient by obtaining and reviewing her medical history (including labs, imaging tests and prior procedures), documenting clinical information in the electronic health record (EHR), counseling and coordinating care plans, writing and sending prescriptions, ordering tests or procedures and in direct communicating with the patient and medical staff discussing pertinent items from her history and physical exam.  Finis Bud, M.D. 03/17/2021 9:24 AM

## 2021-03-25 LAB — CYTOLOGY - PAP
Comment: NEGATIVE
Diagnosis: UNDETERMINED — AB
High risk HPV: NEGATIVE

## 2021-03-26 NOTE — Progress Notes (Signed)
This is an excellent result after LEEP.  It is considered a negative Pap.  Your HPV is even negative!.  Next follow-up Pap should be in approximately 6 months as we previously discussed.

## 2021-04-01 DIAGNOSIS — Z23 Encounter for immunization: Secondary | ICD-10-CM | POA: Diagnosis not present

## 2021-04-27 ENCOUNTER — Encounter: Payer: BC Managed Care – PPO | Admitting: Certified Nurse Midwife

## 2021-05-07 ENCOUNTER — Telehealth: Payer: BC Managed Care – PPO | Admitting: Emergency Medicine

## 2021-05-07 DIAGNOSIS — R300 Dysuria: Secondary | ICD-10-CM

## 2021-05-07 DIAGNOSIS — R350 Frequency of micturition: Secondary | ICD-10-CM | POA: Diagnosis not present

## 2021-05-07 MED ORDER — NITROFURANTOIN MONOHYD MACRO 100 MG PO CAPS: 100.0000 mg | ORAL_CAPSULE | Freq: Two times a day (BID) | ORAL | 0 refills | Status: AC

## 2021-05-07 NOTE — Progress Notes (Signed)

## 2021-05-07 NOTE — Progress Notes (Signed)
I have spent 5 minutes in review of e-visit questionnaire, review and updating patient chart, medical decision making and response to patient.   Lizmary Nader, PA-C    

## 2021-05-18 ENCOUNTER — Telehealth: Payer: Self-pay

## 2021-05-18 NOTE — Telephone Encounter (Signed)
Lvm for pt to call and schedule an appt    Copied from [place] [id number]. Topic: Appointment Scheduling - Scheduling Inquiry for Clinic >> May 18, 2021  8:54 AM Elenora Fender, [place] A, [place] wrote: Reason for CRM: patient wants to f/u on lump under her right arm that she has been monitoring for the past 6 months >> May 18, 2021  9:00 AM [place], [place] A, [place] wrote: Patient stated that she has been seen for this before and was told to monitor it. She stated that it has not gotten better and wanted it to be checked. Patient prefers an early morning or late afternoon appointment.  Contact information has been verified.

## 2021-05-29 ENCOUNTER — Ambulatory Visit: Payer: BC Managed Care – PPO | Admitting: Nurse Practitioner

## 2021-09-01 ENCOUNTER — Telehealth: Payer: BC Managed Care – PPO | Admitting: Physician Assistant

## 2021-09-01 DIAGNOSIS — J029 Acute pharyngitis, unspecified: Secondary | ICD-10-CM

## 2021-09-01 NOTE — Progress Notes (Signed)
?  E-Visit for Sore Throat ? ?We are sorry that you are not feeling well.  Here is how we plan to help! ? ?Your symptoms indicate a likely viral infection (Pharyngitis).   Pharyngitis is inflammation in the back of the throat which can cause a sore throat, scratchiness and sometimes difficulty swallowing.   Pharyngitis is typically caused by a respiratory virus and will just run its course.  Please keep in mind that your symptoms could last up to 10 days.  For throat pain, we recommend over the counter oral pain relief medications such as acetaminophen or aspirin, or anti-inflammatory medications such as ibuprofen or naproxen sodium.  Topical treatments such as oral throat lozenges or sprays may be used as needed.  Avoid close contact with loved ones, especially the very young and elderly.  Remember to wash your hands thoroughly throughout the day as this is the number one way to prevent the spread of infection and wipe down door knobs and counters with disinfectant. ? ?You can use OTC Delsym or Mucinex-DM to help with cough from sore throat.  ? ?After careful review of your answers, I would not recommend an antibiotic for your condition.  Antibiotics should not be used to treat conditions that we suspect are caused by viruses like the virus that causes the common cold or flu. However, some people can have Strep with atypical symptoms. You may need formal testing in clinic or office to confirm if your symptoms continue or worsen. ? ?Providers prescribe antibiotics to treat infections caused by bacteria. Antibiotics are very powerful in treating bacterial infections when they are used properly.  To maintain their effectiveness, they should be used only when necessary.  Overuse of antibiotics has resulted in the development of super bugs that are resistant to treatment!   ? ?Home Care: ?Only take medications as instructed by your medical team. ?Do not drink alcohol while taking these medications. ?A steam or ultrasonic  humidifier can help congestion.  You can place a towel over your head and breathe in the steam from hot water coming from a faucet. ?Avoid close contacts especially the very young and the elderly. ?Cover your mouth when you cough or sneeze. ?Always remember to wash your hands. ? ?Get Help Right Away If: ?You develop worsening fever or throat pain. ?You develop a severe head ache or visual changes. ?Your symptoms persist after you have completed your treatment plan. ? ?Make sure you ?Understand these instructions. ?Will watch your condition. ?Will get help right away if you are not doing well or get worse. ? ? ?Thank you for choosing an e-visit. ? ?Your e-visit answers were reviewed by a board certified advanced clinical practitioner to complete your personal care plan. Depending upon the condition, your plan could have included both over the counter or prescription medications. ? ?Please review your pharmacy choice. Make sure the pharmacy is open so you can pick up prescription now. If there is a problem, you may contact your provider through [employer] and have the prescription routed to another pharmacy.  Your safety is important to [place]. If you have drug allergies check your prescription carefully.  ? ?For the next 24 hours you can use MyChart to ask questions about today's visit, request a non-urgent call back, or ask for a work or school excuse. ?You will get an email in the next two days asking about your experience. I hope that your e-visit has been valuable and will speed your recovery. ? ?

## 2021-09-01 NOTE — Progress Notes (Signed)
I have spent 5 minutes in review of e-visit questionnaire, review and updating patient chart, medical decision making and response to patient.   Keyonia Gluth Cody Anitra Doxtater, PA-C    

## 2021-09-14 ENCOUNTER — Encounter: Payer: Self-pay | Admitting: Family Medicine

## 2021-09-14 ENCOUNTER — Ambulatory Visit (INDEPENDENT_AMBULATORY_CARE_PROVIDER_SITE_OTHER): Payer: BC Managed Care – PPO | Admitting: Family Medicine

## 2021-09-14 VITALS — BP 116/80 | HR 77 | Temp 97.8°F | Resp 16 | Ht 64.0 in | Wt 156.9 lb

## 2021-09-14 DIAGNOSIS — R0781 Pleurodynia: Secondary | ICD-10-CM

## 2021-09-14 MED ORDER — NAPROXEN 500 MG PO TABS: 500.0000 mg | ORAL_TABLET | Freq: Two times a day (BID) | ORAL | 0 refills | Status: DC

## 2021-09-14 NOTE — Progress Notes (Signed)
? ?  SUBJECTIVE:  ? ?CHIEF COMPLAINT / HPI:  ? ?SIDE PAIN ?- sharp pain under L rib ?Duration: 1 week ?Nature: sharp ?Location: LUQ, under L breast along ribs  ?Radiation: yes, to back ?Frequency: intermittent ?Alleviating factors: tylenol helps some ?Aggravating factors: coughing, bending ?Treatments attempted: tylenol ?Constipation: no ?Diarrhea: no ?Heartburn: no ?Bloating:no ?Nausea: no ?Vomiting: no ?Shortness of breath: no ?Chest pain: no ?Urinary problems: no ? ? ?OBJECTIVE:  ? ?BP 116/80   Pulse 77   Temp 97.8 ?F (36.6 ?C) (Oral)   Resp 16   Ht '5\' 4"'$  (1.626 m)   Wt 156 lb 14.4 oz (71.2 kg)   SpO2 99%   BMI 26.93 kg/m?   ?Gen: well appearing, in NAD ?Card: RRR ?Lungs: CTAB ?MSK: TTP along L anterior 10-11 ribs.  ?Abd: soft, NTND, +BS. No organomegaly. No CVA tenderness. ?Ext: WWP, no edema ? ? ?ASSESSMENT/PLAN:  ? ?Rib pain ?Likely MSK etiology, possible costochondritis given exam and history. Less likely fracture given lack of trauma history. No other findings to suggest intraabdominal pathology. Not likely PE given no SOB, appropriately saturated. No findings to suggest PNA. Rx naproxen scheduled x5 days then prn. F/u prn.  ? ? ?Myles Gip, DO ?

## 2021-09-15 ENCOUNTER — Encounter: Payer: Self-pay | Admitting: Family Medicine

## 2021-10-21 ENCOUNTER — Telehealth: Payer: BC Managed Care – PPO | Admitting: Physician Assistant

## 2021-10-21 DIAGNOSIS — R3989 Other symptoms and signs involving the genitourinary system: Secondary | ICD-10-CM

## 2021-10-21 MED ORDER — SULFAMETHOXAZOLE-TRIMETHOPRIM 800-160 MG PO TABS: 1.0000 | ORAL_TABLET | Freq: Two times a day (BID) | ORAL | 0 refills | Status: DC

## 2021-10-21 NOTE — Progress Notes (Signed)

## 2021-11-09 ENCOUNTER — Other Ambulatory Visit: Payer: Self-pay | Admitting: Obstetrics and Gynecology

## 2021-11-09 DIAGNOSIS — Z1231 Encounter for screening mammogram for malignant neoplasm of breast: Secondary | ICD-10-CM

## 2021-11-12 ENCOUNTER — Ambulatory Visit (INDEPENDENT_AMBULATORY_CARE_PROVIDER_SITE_OTHER): Payer: BC Managed Care – PPO | Admitting: Obstetrics and Gynecology

## 2021-11-12 ENCOUNTER — Encounter: Payer: Self-pay | Admitting: Obstetrics and Gynecology

## 2021-11-12 ENCOUNTER — Other Ambulatory Visit (HOSPITAL_COMMUNITY)
Admission: RE | Admit: 2021-11-12 | Discharge: 2021-11-12 | Disposition: A | Discharge status: Home / Self Care | Payer: BC Managed Care – PPO | Source: Ambulatory Visit | Attending: Obstetrics and Gynecology | Admitting: Obstetrics and Gynecology

## 2021-11-12 VITALS — BP 111/76 | HR 77 | Ht 64.0 in | Wt 149.9 lb

## 2021-11-12 DIAGNOSIS — Z124 Encounter for screening for malignant neoplasm of cervix: Secondary | ICD-10-CM | POA: Insufficient documentation

## 2021-11-12 DIAGNOSIS — N951 Menopausal and female climacteric states: Secondary | ICD-10-CM | POA: Diagnosis not present

## 2021-11-12 DIAGNOSIS — Z1231 Encounter for screening mammogram for malignant neoplasm of breast: Secondary | ICD-10-CM

## 2021-11-12 DIAGNOSIS — Z9889 Other specified postprocedural states: Secondary | ICD-10-CM

## 2021-11-12 DIAGNOSIS — Z01419 Encounter for gynecological examination (general) (routine) without abnormal findings: Secondary | ICD-10-CM

## 2021-11-12 MED ORDER — ESTRADIOL-NORETHINDRONE ACET 1-0.5 MG PO TABS: 1.0000 | ORAL_TABLET | Freq: Every day | ORAL | 3 refills | Status: DC

## 2021-11-12 NOTE — Progress Notes (Signed)
HPI:      Dana Wolf is a 51 y.o. [phone] who LMP was Patient's last menstrual period was 04/18/2021 (approximate).  Subjective:   She presents today for her annual examination.  She states that her hot flashes have become significantly worse and she has them daily.  She finds it disruptive and now would like to restart HRT. Of significant note patient has a history of LEEP for CIN.  This is her second Pap after LEEP.     Hx: The following portions of the patient's history were reviewed and updated as appropriate:             She  has a past medical history of ASCUS with positive high risk HPV cervical (08/04/2016). She does not have any pertinent problems on file. She  has a past surgical history that includes Colposcopy and Tubal ligation. Her family history is not on file. She was adopted. She  reports that she has never smoked. She has never used smokeless tobacco. She reports that she does not drink alcohol and does not use drugs. She has a current medication list which includes the following prescription(s): estradiol-norethindrone and sulfamethoxazole-trimethoprim. She has No Known Allergies.       Review of Systems:  Review of Systems  Constitutional: Denied constitutional symptoms, night sweats, recent illness, fatigue, fever, insomnia and weight loss.  Eyes: Denied eye symptoms, eye pain, photophobia, vision change and visual disturbance.  Ears/Nose/Throat/Neck: Denied ear, nose, throat or neck symptoms, hearing loss, nasal discharge, sinus congestion and sore throat.  Cardiovascular: Denied cardiovascular symptoms, arrhythmia, chest pain/pressure, edema, exercise intolerance, orthopnea and palpitations.  Respiratory: Denied pulmonary symptoms, asthma, pleuritic pain, productive sputum, cough, dyspnea and wheezing.  Gastrointestinal: Denied, gastro-esophageal reflux, melena, nausea and vomiting.  Genitourinary: Denied genitourinary symptoms including symptomatic vaginal  discharge, pelvic relaxation issues, and urinary complaints.  Musculoskeletal: Denied musculoskeletal symptoms, stiffness, swelling, muscle weakness and myalgia.  Dermatologic: Denied dermatology symptoms, rash and scar.  Neurologic: Denied neurology symptoms, dizziness, headache, neck pain and syncope.  Psychiatric: Denied psychiatric symptoms, anxiety and depression.  Endocrine: Denied endocrine symptoms including hot flashes and night sweats.   Meds:   Current Outpatient Medications on File Prior to Visit  Medication Sig Dispense Refill   sulfamethoxazole-trimethoprim (BACTRIM DS) 800-160 MG tablet Take 1 tablet by mouth 2 (two) times daily. (Patient not taking: Reported on 11/12/2021) 10 tablet 0   No current facility-administered medications on file prior to visit.     Objective:     Vitals:   11/12/21 0814  BP: 111/76  Pulse: 77    Filed Weights   11/12/21 0814  Weight: 149 lb 14.4 oz (68 kg)              Physical examination General NAD, Conversant  HEENT Atraumatic; Op clear with mmm.  Normo-cephalic. Pupils reactive. Anicteric sclerae  Thyroid/Neck Smooth without nodularity or enlargement. Normal ROM.  Neck Supple.  Skin No rashes, lesions or ulceration. Normal palpated skin turgor. No nodularity.  Breasts: No masses or discharge.  Symmetric.  No axillary adenopathy.  Lungs: Clear to auscultation.No rales or wheezes. Normal Respiratory effort, no retractions.  Heart: NSR.  No murmurs or rubs appreciated. No periferal edema  Abdomen: Soft.  Non-tender.  No masses.  No HSM. No hernia  Extremities: Moves all appropriately.  Normal ROM for age. No lymphadenopathy.  Neuro: Oriented to PPT.  Normal mood. Normal affect.     Pelvic:   Vulva: Normal appearance.  No  lesions.  Vagina: No lesions or abnormalities noted.  Support: Normal pelvic support.  Urethra No masses tenderness or scarring.  Meatus Normal size without lesions or prolapse.  Cervix: Normal appearance.  No  lesions.  Anus: Normal exam.  No lesions.  Perineum: Normal exam.  No lesions.        Bimanual   Uterus: Normal size.  Non-tender.  Mobile.  AV.  Adnexae: No masses.  Non-tender to palpation.  Cul-de-sac: Negative for abnormality.     Assessment:    [id number] Patient Active Problem List   Diagnosis Date Noted   Heavy periods 07/18/2017   Abnormal breast finding 03/10/2017   ASCUS with positive high risk HPV cervical 08/04/2016   Irregular periods 10/30/2015   Fatigue 10/30/2015     1. Well woman exam   2. Cervical cancer screening   3. Symptomatic menopausal or female climacteric states   4. History of loop electrical excision procedure (LEEP)     Patient's menopausal symptoms have become disruptive and she would like to start HRT.   Plan:            1.  Basic Screening Recommendations The basic screening recommendations for asymptomatic women were discussed with the patient during her visit.  The age-appropriate recommendations were discussed with her and the rational for the tests reviewed.  When I am informed by the patient that another primary care physician has previously obtained the age-appropriate tests and they are up-to-date, only outstanding tests are ordered and referrals given as necessary.  Abnormal results of tests will be discussed with her when all of her results are completed.  Routine preventative health maintenance measures emphasized: Exercise/Diet/Weight control, Tobacco Warnings, Alcohol/Substance use risks and Stress Management Pap performed -patient is already scheduled for mammography -lab work today 2.  Once again discussed HRT and patient has decided to begin [place].  She is to contact us if she has any issues with her Activella. 3.  We will base next visit on Pap smear.  Plan for 1 year but if abnormal consider 56-monthfollow-up.  Orders Orders Placed This Encounter  Procedures   Basic metabolic panel   CBC   Lipid panel   Hemoglobin A1c    TSH     Meds ordered this encounter  Medications   estradiol-norethindrone (ACTIVELLA) 1-0.5 MG tablet    Sig: Take 1 tablet by mouth daily.    Dispense:  90 tablet    Refill:  3         F/U  Return in about 1 year (around 11/13/2022) for Annual Physical.  DFinis Bud M.D. 11/12/2021 8:32 AM

## 2021-11-12 NOTE — Progress Notes (Signed)
Patients presents for annual exam today. She states no menstrual period since November, now experiencing hot flashes and night sweats. Patient is due for pap smear, ordered. Patient is scheduled for mammogram 12/17/21. Patients annual labs are ordered. Patient states no other questions or concerns at this time.

## 2021-11-13 LAB — CBC
Hematocrit: 38.8 % (ref 34.0–46.6)
Hemoglobin: 13.2 g/dL (ref 11.1–15.9)
MCH: 31.4 pg (ref 26.6–33.0)
MCHC: 34 g/dL (ref 31.5–35.7)
MCV: 92 fL (ref 79–97)
Platelets: 283 10*3/uL (ref 150–450)
RBC: 4.2 x10E6/uL (ref 3.77–5.28)
RDW: 11.4 % — ABNORMAL LOW (ref 11.7–15.4)
WBC: 6.9 10*3/uL (ref 3.4–10.8)

## 2021-11-13 LAB — LIPID PANEL
Chol/HDL Ratio: 3 ratio (ref 0.0–4.4)
Cholesterol, Total: 158 mg/dL (ref 100–199)
HDL: 52 mg/dL (ref >39)
LDL Chol Calc (NIH): 91 mg/dL (ref 0–99)
Triglycerides: 79 mg/dL (ref 0–149)
VLDL Cholesterol Cal: 15 mg/dL (ref 5–40)

## 2021-11-13 LAB — BASIC METABOLIC PANEL
BUN/Creatinine Ratio: 11 (ref 9–23)
BUN: 8 mg/dL (ref 6–24)
CO2: 26 mmol/L (ref 20–29)
Calcium: 9.7 mg/dL (ref 8.7–10.2)
Chloride: 103 mmol/L (ref 96–106)
Creatinine, Ser: 0.7 mg/dL (ref 0.57–1.00)
Glucose: 88 mg/dL (ref 70–99)
Potassium: 3.8 mmol/L (ref 3.5–5.2)
Sodium: 142 mmol/L (ref 134–144)
eGFR: 107 mL/min/{1.73_m2} (ref >59)

## 2021-11-13 LAB — HEMOGLOBIN A1C
Est. average glucose Bld gHb Est-mCnc: 120 mg/dL
Hgb A1c MFr Bld: 5.8 % — ABNORMAL HIGH (ref 4.8–5.6)

## 2021-11-13 LAB — TSH: TSH: 1.16 u[IU]/mL (ref 0.450–4.500)

## 2021-11-16 LAB — CYTOLOGY - PAP
Comment: NEGATIVE
Diagnosis: UNDETERMINED — AB
High risk HPV: NEGATIVE

## 2021-11-23 ENCOUNTER — Telehealth: Payer: BC Managed Care – PPO | Admitting: Physician Assistant

## 2021-11-23 DIAGNOSIS — H109 Unspecified conjunctivitis: Secondary | ICD-10-CM

## 2021-11-23 MED ORDER — POLYMYXIN B-TRIMETHOPRIM 10000-0.1 UNIT/ML-% OP SOLN: 1.0000 [drp] | OPHTHALMIC | 0 refills | Status: DC

## 2021-11-23 NOTE — Progress Notes (Signed)

## 2021-12-07 ENCOUNTER — Ambulatory Visit: Payer: BC Managed Care – PPO

## 2021-12-16 ENCOUNTER — Ambulatory Visit (INDEPENDENT_AMBULATORY_CARE_PROVIDER_SITE_OTHER): Payer: BC Managed Care – PPO | Admitting: Family Medicine

## 2021-12-16 ENCOUNTER — Encounter: Payer: Self-pay | Admitting: Family Medicine

## 2021-12-16 VITALS — BP 112/70 | HR 89 | Temp 98.1°F | Resp 16 | Ht 64.0 in | Wt 149.5 lb

## 2021-12-16 DIAGNOSIS — F4321 Adjustment disorder with depressed mood: Secondary | ICD-10-CM | POA: Diagnosis not present

## 2021-12-16 DIAGNOSIS — F4320 Adjustment disorder, unspecified: Secondary | ICD-10-CM | POA: Insufficient documentation

## 2021-12-16 DIAGNOSIS — N951 Menopausal and female climacteric states: Secondary | ICD-10-CM | POA: Insufficient documentation

## 2021-12-16 MED ORDER — HYDROXYZINE PAMOATE 25 MG PO CAPS: 25.0000 mg | ORAL_CAPSULE | Freq: Three times a day (TID) | ORAL | 2 refills | Status: DC | PRN

## 2021-12-16 MED ORDER — VENLAFAXINE HCL ER 37.5 MG PO CP24: 37.5000 mg | ORAL_CAPSULE | Freq: Every day | ORAL | 0 refills | Status: DC

## 2021-12-16 NOTE — Progress Notes (Signed)
Patient ID: Dana Wolf, female    DOB: 25-Feb-1971, 51 y.o.   MRN: [id number]  PCP: Delsa Grana, PA-C  Chief Complaint  Patient presents with   Depression    Onset for 6 months    Subjective:   Dana Wolf is a 51 y.o. female, presents to clinic with CC of the following:  Depression         Pt presents to discuss grieving and loss of her husband with anxiety and depression sx Her husband passed away suddenly in 06-08-23 about 7 to 8 months ago, very unexpected She has been having difficulty with her moods, crying, low energy, difficulty get out of beds a lot of days, some nights she has difficulty sleeping.   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 She now lives alone with her young adult daughter who is returning home     12/16/2021    2:36 PM 09/14/2021    1:23 PM 03/17/2021    9:15 AM  Depression screen PHQ 2/9  Decreased Interest 3 1 0  Down, Depressed, Hopeless 3 1 0  PHQ - 2 Score 6 2 0  Altered sleeping 3 1   Tired, decreased energy 3 1   Change in appetite 3 0   Feeling bad or failure about yourself  3 0   Trouble concentrating 3 0   Moving slowly or fidgety/restless 0 0   Suicidal thoughts 0 0   PHQ-9 Score 21 4   Difficult doing work/chores Very difficult Somewhat difficult       12/16/2021    2:41 PM  GAD 7 : Generalized Anxiety Score  Nervous, Anxious, on Edge 0  Control/stop worrying 0  Worry too much - different things 0  Trouble relaxing 0  Restless 0  Easily annoyed or irritable 0  Afraid - awful might happen 0  Total GAD 7 Score 0  Anxiety Difficulty Not difficult at all    Aneta Mins therapist in [facility]  Husband passed Dec Birthday Jan Anniversary Nov    Patient Active Problem List   Diagnosis Date Noted   Heavy periods 07/18/2017   Abnormal breast finding 03/10/2017   ASCUS with positive high risk HPV cervical 08/04/2016   Irregular periods 10/30/2015   Fatigue  10/30/2015      Current Outpatient Medications:    estradiol-norethindrone (ACTIVELLA) 1-0.5 MG tablet, Take 1 tablet by mouth daily., Disp: 90 tablet, Rfl: 3   sulfamethoxazole-trimethoprim (BACTRIM DS) 800-160 MG tablet, Take 1 tablet by mouth 2 (two) times daily. (Patient not taking: Reported on 11/12/2021), Disp: 10 tablet, Rfl: 0   trimethoprim-polymyxin b (POLYTRIM) ophthalmic solution, Place 1 drop into the right eye every 4 (four) hours. X 5 days (Patient not taking: Reported on 12/16/2021), Disp: 10 mL, Rfl: 0   No Known Allergies   Social History   Tobacco Use   Smoking status: Never   Smokeless tobacco: Never  Vaping Use   Vaping Use: Never used  Substance Use Topics   Alcohol use: No    Alcohol/week: 0.0 standard drinks of alcohol   Drug use: No      Chart Review Today: I personally reviewed active problem list, medication list, allergies, family history, social history, health maintenance, notes from last encounter, lab results, imaging with the patient/caregiver today.   Review of Systems  Constitutional: Negative.   HENT: Negative.    Eyes: Negative.  Respiratory: Negative.    Cardiovascular: Negative.   Gastrointestinal: Negative.   Endocrine: Negative.   Genitourinary: Negative.   Musculoskeletal: Negative.   Skin: Negative.   Allergic/Immunologic: Negative.   Neurological: Negative.   Hematological: Negative.   Psychiatric/Behavioral:  Positive for depression.   All other systems reviewed and are negative.      Objective:   Vitals:   12/16/21 1438  BP: 112/70  Pulse: 89  Resp: 16  Temp: 98.1 F (36.7 C)  TempSrc: Oral  SpO2: 98%  Weight: 149 lb 8 oz (67.8 kg)  Height: 5' 4"  (1.626 m)    Body mass index is 25.66 kg/m.  Physical Exam Vitals and nursing note reviewed.  Constitutional:      Appearance: She is well-developed.  HENT:     Head: Normocephalic and atraumatic.     Nose: Nose normal.  Eyes:     General:        Right  eye: No discharge.        Left eye: No discharge.     Conjunctiva/sclera: Conjunctivae normal.  Neck:     Trachea: No tracheal deviation.  Cardiovascular:     Rate and Rhythm: Normal rate and regular rhythm.  Pulmonary:     Effort: Pulmonary effort is normal. No respiratory distress.     Breath sounds: No stridor.  Musculoskeletal:        General: Normal range of motion.  Skin:    General: Skin is warm and dry.     Findings: No rash.  Neurological:     Mental Status: She is alert.     Motor: No abnormal muscle tone.     Coordination: Coordination normal.  Psychiatric:        Attention and Perception: Attention and perception normal.        Mood and Affect: Mood is depressed. Affect is tearful.        Speech: Speech normal.        Behavior: Behavior normal. Behavior is cooperative.        Thought Content: Thought content normal. Thought content does not include homicidal or suicidal ideation. Thought content does not include homicidal or suicidal plan.      Results for orders placed or performed in visit on 28/41/32  Basic metabolic panel  Result Value Ref Range   Glucose 88 70 - 99 mg/dL   BUN 8 6 - 24 mg/dL   Creatinine, Ser 0.70 0.57 - 1.00 mg/dL   eGFR 107 >59 mL/min/1.73   BUN/Creatinine Ratio 11 9 - 23   Sodium 142 134 - 144 mmol/L   Potassium 3.8 3.5 - 5.2 mmol/L   Chloride 103 96 - 106 mmol/L   CO2 26 20 - 29 mmol/L   Calcium 9.7 8.7 - 10.2 mg/dL  CBC  Result Value Ref Range   WBC 6.9 3.4 - 10.8 x10E3/uL   RBC 4.20 3.77 - 5.28 x10E6/uL   Hemoglobin 13.2 11.1 - 15.9 g/dL   Hematocrit 38.8 34.0 - 46.6 %   MCV 92 79 - 97 fL   MCH 31.4 26.6 - 33.0 pg   MCHC 34.0 31.5 - 35.7 g/dL   RDW 11.4 (L) 11.7 - 15.4 %   Platelets 283 150 - 450 x10E3/uL  Lipid panel  Result Value Ref Range   Cholesterol, Total 158 100 - 199 mg/dL   Triglycerides 79 0 - 149 mg/dL   HDL 52 >39 mg/dL   VLDL Cholesterol Cal 15 5 - 40 mg/dL  LDL Chol Calc (NIH) 91 0 - 99 mg/dL   Chol/HDL  Ratio 3.0 0.0 - 4.4 ratio  Hemoglobin A1c  Result Value Ref Range   Hgb A1c MFr Bld 5.8 (H) 4.8 - 5.6 %   Est. average glucose Bld gHb Est-mCnc 120 mg/dL  TSH  Result Value Ref Range   TSH 1.160 0.450 - 4.500 uIU/mL  Cytology - PAP  Result Value Ref Range   High risk HPV Negative    Adequacy      Satisfactory for evaluation; transformation zone component PRESENT.   Diagnosis (A)     - Atypical squamous cells of undetermined significance (ASC-US)   Comment Normal Reference Range HPV - Negative        Assessment & Plan:   1. Adjustment disorder with depressed mood Pt with unexpected and sudden loss of her husband in Dec (upon further chart review - her husband was my patient and he was brought to the ED DOA - no ER record so likely DOA at home - pt said she doesn't know why he died.  It looks like the case did not go to ME, unfortunately the death certificate ended up being signed later by my SP since I was out of office on FMLA)  Pt just started to work with a therapist -appointments, instructed to contact PCP to discuss possible medications She has not yet had a follow-up appointment Patient has no history of anxiety or depression and there is no family history of mental health disease other diagnoses She is mostly feeling down and depressed, having excessive sadness, lack of motivation, decreased energy, hard to get out of bed, sometimes at night she has difficulty sleep being, she endorses increased moodiness She has been able to go to work and continue working  She is very tearful during OV I explained adjustment disorder, sx, expected grief and loss sx - supportive listening Encouraged her to continue working with therapists - closely initially and also anticipate more visits needed for support with anniversaries Reviewed medication options that may help with most extreme sx  I've explained to her that drugs of the SSRI class can have side effects such as weight gain, sexual  dysfunction, insomnia, headache, nausea. These medications are generally effective at alleviating symptoms of anxiety and/or depression.  With experiencing menopausal symptoms including hot flashes and sweats -I explained that we could try Effexor as a SNRI that may also help minimize her menopausal syndrome symptoms she did just start hormonal placement therapy with Dr. Amalia Hailey she has not noticed a improvement in her symptoms just yet We will start low-dose Effexor anticipate increasing this dose over the next month, close follow-up, explained expected side effects particularly in the first week which should resolve in 1 to 2 weeks and explained that she would likely not see the medication benefits until she is taking them for about 6 weeks continuously Hydroxyzine PRN for anxiety, crying/emotional fits, or insomnia  - venlafaxine XR (EFFEXOR XR) 37.5 MG 24 hr capsule; Take 1 capsule (37.5 mg total) by mouth daily with breakfast.  Dispense: 90 capsule; Refill: 0 - hydrOXYzine (VISTARIL) 25 MG capsule; Take 1 capsule (25 mg total) by mouth every 8 (eight) hours as needed (anxiety or at bedtime for insomnia).  Dispense: 30 capsule; Refill: 2  2. Grief reaction See above - venlafaxine XR (EFFEXOR XR) 37.5 MG 24 hr capsule; Take 1 capsule (37.5 mg total) by mouth daily with breakfast.  Dispense: 90 capsule; Refill: 0 - hydrOXYzine (VISTARIL)  25 MG capsule; Take 1 capsule (25 mg total) by mouth every 8 (eight) hours as needed (anxiety or at bedtime for insomnia).  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 Amalia Hailey, sx not much improved  - venlafaxine XR (EFFEXOR XR) 37.5 MG 24 hr capsule; Take 1 capsule (37.5 mg total) by mouth daily with breakfast.  Dispense: 90 capsule; Refill: 0   Return for 4-6 week f/up for meds/mood virtual or in office - per pt preference .    Delsa Grana, PA-C 12/16/21 3:09 PM

## 2021-12-17 ENCOUNTER — Telehealth: Payer: Self-pay | Admitting: Family Medicine

## 2021-12-17 NOTE — Telephone Encounter (Signed)
Dana Wolf from [facility] stated they do not have medical records for patient. They only do mental terpay services when patient is needing them. FYI

## 2021-12-17 NOTE — Telephone Encounter (Signed)
Copied from [place]. Topic: General - Other >> Dec 17, 2021 10:20 AM [place] B wrote: Reason for RAQ:TMAUQ from [facility], called about message left for them about paperwork for patient, but she says they don't fill out any paperwork, because therapist is not s Dr ,she is a Presenter, broadcasting. Pleas call back if need be.

## 2021-12-17 NOTE — Telephone Encounter (Signed)
Called back Dana Wolf from [facility] no answer left vm to call back.

## 2021-12-18 NOTE — Telephone Encounter (Signed)
Called [facility] to re verify no answer left vm to call back.

## 2021-12-21 NOTE — Telephone Encounter (Signed)
Spoke with patient and told her Kenneth from [facility] had told me they can not release any medical records due to them just being a licensed therapist not a doctor. Pt told me she has been trying to get in touch with them and she can not get a hold of them as well.

## 2021-12-21 NOTE — Telephone Encounter (Signed)
2nd attempt no answer. Left vm.

## 2021-12-21 NOTE — Telephone Encounter (Signed)
I have called pt to ask about [facility]. Left vm to return my call.

## 2022-01-04 ENCOUNTER — Ambulatory Visit: Payer: Self-pay | Admitting: Nurse Practitioner

## 2022-01-04 ENCOUNTER — Encounter: Payer: Self-pay | Admitting: Nurse Practitioner

## 2022-01-04 DIAGNOSIS — Z113 Encounter for screening for infections with a predominantly sexual mode of transmission: Secondary | ICD-10-CM

## 2022-01-04 LAB — WET PREP FOR TRICH, YEAST, CLUE
Trichomonas Exam: NEGATIVE
Yeast Exam: NEGATIVE

## 2022-01-04 LAB — HM HIV SCREENING LAB: HM HIV Screening: NEGATIVE

## 2022-01-04 NOTE — Progress Notes (Unsigned)
Pt here for STD screening.  Wet mount results reviewed, no treatment required per SO.  Condoms given.  Windle Guard, RN

## 2022-01-04 NOTE — Progress Notes (Unsigned)
[facility] Department  STI clinic/screening visit [place] [address] [phone]  Subjective:  Keaunna HAILEYANN STAIGER is a 51 y.o. female being seen today for an STI screening visit. The patient reports they do have symptoms.  Patient reports that they do not desire a pregnancy in the next year.   They reported they are not interested in discussing contraception today.  Patient is currently perimenopausal.   No LMP recorded. (Menstrual status: Perimenopausal).   Patient has the following medical conditions:   Patient Active Problem List   Diagnosis Date Noted   Adjustment disorder with depressed mood 12/16/2021   Grief reaction 12/16/2021   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/2021   ASCUS with positive high risk HPV cervical 08/04/2016    Chief Complaint  Patient presents with   SEXUALLY TRANSMITTED DISEASE    Screening - patient stated she is having some Posey Petrik discharge     HPI  Patient reports to clinic today for STD screening.  Patient reports discharge that has been present for one week.    Last HIV test per patient/review of record was 08/02/2016 Patient reports last pap was 11/12/2021.   Screening for MPX risk: Does the patient have an unexplained rash? No Is the patient MSM? No Does the patient endorse multiple sex partners or anonymous sex partners? No Did the patient have close or sexual contact with a person diagnosed with MPX? No Has the patient traveled outside the [place] where MPX is endemic? No Is there a high clinical suspicion for MPX-- evidenced by one of the following No  -Unlikely to be chickenpox  -Lymphadenopathy  -Rash that present in same phase of evolution on any given body part See flowsheet for further details and programmatic requirements.   Immunization history:  Immunization History  Administered Date(s) Administered   Influenza,inj,Quad PF,6+ Mos 04/25/2020     The following portions of the patient's history were  reviewed and updated as appropriate: allergies, current medications, past medical history, past social history, past surgical history and problem list.  Objective:  There were no vitals filed for this visit.  Physical Exam Constitutional:      Appearance: Normal appearance.  HENT:     Head: Normocephalic. No abrasion, masses or laceration. Hair is normal.     Right Ear: External ear normal.     Left Ear: External ear normal.     Nose: Nose normal.     Mouth/Throat:     Lips: Pink.     Mouth: Mucous membranes are moist. No oral lesions.     Dentition: No dental caries.     Pharynx: No oropharyngeal exudate or posterior oropharyngeal erythema.     Tonsils: No tonsillar exudate or tonsillar abscesses.  Eyes:     General: Lids are normal.        Right eye: No discharge.        Left eye: No discharge.     Conjunctiva/sclera: Conjunctivae normal.     Right eye: No exudate.    Left eye: No exudate. Abdominal:     General: Abdomen is flat.     Palpations: Abdomen is soft.     Tenderness: There is no abdominal tenderness. There is no rebound.  Genitourinary:    Pubic Area: No rash or pubic lice.      Labia:        Right: No rash, tenderness, lesion or injury.        Left: No rash, tenderness, lesion or injury.  Vagina: Normal. No vaginal discharge, erythema or lesions.     Cervix: No cervical motion tenderness, discharge, lesion or erythema.     Uterus: Not enlarged and not tender.      Rectum: Normal.     Comments: Amount Discharge: small  Odor: No pH: less than 4.5 Adheres to vaginal wall: No Color: color of discharge matches the [place] Graham swab  Musculoskeletal:     Cervical back: Full passive range of motion without pain, normal range of motion and neck supple.  Lymphadenopathy:     Cervical: No cervical adenopathy.     Right cervical: No superficial, deep or posterior cervical adenopathy.    Left cervical: No superficial, deep or posterior cervical adenopathy.     Upper  Body:     Right upper body: No supraclavicular, axillary or epitrochlear adenopathy.     Left upper body: No supraclavicular, axillary or epitrochlear adenopathy.     Lower Body: No right inguinal adenopathy. No left inguinal adenopathy.  Skin:    General: Skin is warm and dry.     Findings: No lesion or rash.  Neurological:     Mental Status: She is alert and oriented to person, place, and time.  Psychiatric:        Attention and Perception: Attention normal.        Mood and Affect: Mood normal.        Speech: Speech normal.        Behavior: Behavior normal. Behavior is cooperative.      Assessment and Plan:  Ashtynn L Littleton is a 51 y.o. female presenting to the [facility] Department for STI screening  1. Screening examination for venereal disease -51 year old female in clinic today for STD screening. -Patient accepted all screenings including oral, vaginal, rectal CT/GC, wet prep and bloodwork for HIV/RPR.  Patient meets criteria for HepB screening? No. Ordered? No - low risk  Patient meets criteria for HepC screening? No. Ordered? No - low risk   Treat wet prep per standing order Discussed time line for [facility] results and that patient will be called with positive results and encouraged patient to call if she had not heard in 2 weeks.  Counseled to return or seek care for continued or worsening symptoms Recommended condom use with all sex  Patient is currently not using  contraception  to prevent pregnancy.  Patient is perimenopausal.    - HIV [place] [facility] - Syphilis Serology, [facility] - [place], YEAST, CLUE  Total time spent: 30 minutes    Return if symptoms worsen or fail to improve.    Gregary Cromer, FNP

## 2022-01-20 ENCOUNTER — Encounter: Payer: Self-pay | Admitting: Family Medicine

## 2022-01-20 ENCOUNTER — Ambulatory Visit (INDEPENDENT_AMBULATORY_CARE_PROVIDER_SITE_OTHER): Payer: BC Managed Care – PPO | Admitting: Family Medicine

## 2022-01-20 VITALS — BP 110/68 | HR 83 | Temp 97.9°F | Resp 16 | Ht 64.0 in | Wt 150.9 lb

## 2022-01-20 DIAGNOSIS — F4321 Adjustment disorder with depressed mood: Secondary | ICD-10-CM

## 2022-01-20 DIAGNOSIS — Z5181 Encounter for therapeutic drug level monitoring: Secondary | ICD-10-CM

## 2022-01-20 DIAGNOSIS — N951 Menopausal and female climacteric states: Secondary | ICD-10-CM

## 2022-01-20 DIAGNOSIS — T753XXA Motion sickness, initial encounter: Secondary | ICD-10-CM | POA: Diagnosis not present

## 2022-01-20 MED ORDER — VENLAFAXINE HCL ER 37.5 MG PO CP24: 37.5000 mg | ORAL_CAPSULE | Freq: Every day | ORAL | 3 refills | Status: AC

## 2022-01-20 MED ORDER — MECLIZINE HCL 25 MG PO TABS: 12.5000 mg | ORAL_TABLET | Freq: Two times a day (BID) | ORAL | 0 refills | Status: DC | PRN

## 2022-01-20 NOTE — Progress Notes (Unsigned)
Patient ID: Dana Wolf, female    DOB: [birth year], 51 y.o.   MRN: [id number]  PCP: Delsa Grana, PA-C  No chief complaint on file.   Subjective:   Dana Wolf is a 51 y.o. female, presents to clinic with CC of the following:  Presented in July for help with depressive sx/grief, she is here for follow up after starting meds  Depression         Pt presents to discuss grieving and loss of her husband with anxiety and depression sx Her husband passed away suddenly in May 23, 2023 about 7 to 8 months ago, very unexpected She has been having difficulty with her moods, crying, low energy, difficulty get out of beds a lot of days, some nights she has difficulty sleeping.   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 She now lives alone with her young adult daughter who is returning home     12/16/2021    2:36 PM 09/14/2021    1:23 PM 03/17/2021    9:15 AM  Depression screen PHQ 2/9  Decreased Interest 3 1 0  Down, Depressed, Hopeless 3 1 0  PHQ - 2 Score 6 2 0  Altered sleeping 3 1   Tired, decreased energy 3 1   Change in appetite 3 0   Feeling bad or failure about yourself  3 0   Trouble concentrating 3 0   Moving slowly or fidgety/restless 0 0   Suicidal thoughts 0 0   PHQ-9 Score 21 4   Difficult doing work/chores Very difficult Somewhat difficult       12/16/2021    2:41 PM  GAD 7 : Generalized Anxiety Score  Nervous, Anxious, on Edge 0  Control/stop worrying 0  Worry too much - different things 0  Trouble relaxing 0  Restless 0  Easily annoyed or irritable 0  Afraid - awful might happen 0  Total GAD 7 Score 0  Anxiety Difficulty Not difficult at all    Aneta Mins therapist in [facility]  Husband passed Dec Birthday Jan Anniversary Nov   Interval Hx 01/20/22   Started SNRI after extensive discussion of her sx and recent menopausal syndrome sx Effexor 37.5 mg daily She notes improved moods overall, less  crying, still having days that are hard Side Effects no SE Therapy - still working with her therapist Work/family/friends things are good.    12/16/2021    2:36 PM 09/14/2021    1:23 PM 03/17/2021    9:15 AM  Depression screen PHQ 2/9  Decreased Interest 3 1 0  Down, Depressed, Hopeless 3 1 0  PHQ - 2 Score 6 2 0  Altered sleeping 3 1   Tired, decreased energy 3 1   Change in appetite 3 0   Feeling bad or failure about yourself  3 0   Trouble concentrating 3 0   Moving slowly or fidgety/restless 0 0   Suicidal thoughts 0 0   PHQ-9 Score 21 4   Difficult doing work/chores Very difficult Somewhat difficult    FMLA forms   Patient Active Problem List   Diagnosis Date Noted   Adjustment disorder with depressed mood 12/16/2021   Grief reaction 12/16/2021   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/2021   ASCUS with positive high risk HPV cervical 08/04/2016      Current Outpatient Medications:    estradiol-norethindrone (ACTIVELLA) 1-0.5 MG tablet, Take 1 tablet by mouth daily.,  Disp: 90 tablet, Rfl: 3   hydrOXYzine (VISTARIL) 25 MG capsule, Take 1 capsule (25 mg total) by mouth every 8 (eight) hours as needed (anxiety or at bedtime for insomnia)., Disp: 30 capsule, Rfl: 2   venlafaxine XR (EFFEXOR XR) 37.5 MG 24 hr capsule, Take 1 capsule (37.5 mg total) by mouth daily with breakfast., Disp: 90 capsule, Rfl: 0   No Known Allergies   Social History   Tobacco Use   Smoking status: Never   Smokeless tobacco: Never  Vaping Use   Vaping Use: Never used  Substance Use Topics   Alcohol use: Yes    Comment: occassionally   Drug use: No      Chart Review Today: I personally reviewed active problem list, medication list, allergies, family history, social history, health maintenance, notes from last encounter, lab results, imaging with the patient/caregiver today.    Review of Systems  Constitutional: Negative.   HENT: Negative.    Eyes: Negative.   Respiratory: Negative.     Cardiovascular: Negative.   Gastrointestinal: Negative.   Endocrine: Negative.   Genitourinary: Negative.   Musculoskeletal: Negative.   Skin: Negative.   Allergic/Immunologic: Negative.   Neurological: Negative.   Hematological: Negative.   Psychiatric/Behavioral:  Positive for depression.   All other systems reviewed and are negative.      Objective:   There were no vitals filed for this visit.   There is no height or weight on file to calculate BMI.  Physical Exam Vitals and nursing note reviewed.  Constitutional:      General: She is not in acute distress.    Appearance: Normal appearance. She is well-developed and normal weight. She is not ill-appearing, toxic-appearing or diaphoretic.  HENT:     Head: Normocephalic and atraumatic.     Nose: Nose normal.  Eyes:     General:        Right eye: No discharge.        Left eye: No discharge.     Conjunctiva/sclera: Conjunctivae normal.  Neck:     Trachea: No tracheal deviation.  Cardiovascular:     Rate and Rhythm: Normal rate and regular rhythm.  Pulmonary:     Effort: Pulmonary effort is normal. No respiratory distress.     Breath sounds: No stridor.  Musculoskeletal:        General: Normal range of motion.  Skin:    General: Skin is warm and dry.     Findings: No rash.  Neurological:     Mental Status: She is alert.     Motor: No abnormal muscle tone.     Coordination: Coordination normal.  Psychiatric:        Attention and Perception: Attention normal.        Mood and Affect: Mood and affect normal.        Speech: Speech normal.        Behavior: Behavior normal. Behavior is cooperative.      Results for orders placed or performed in visit on 01/14/22  [facility] HIV SCREENING LAB  Result Value Ref Range   [facility] HIV Screening Negative - Validated        Assessment & Plan:     1. Adjustment disorder with depressed mood Patient presents with improved symptoms she tolerated Effexor without any concerning  side effects overall doing better, she is continue to work with her therapist. Med refill sent 10, encouraged continued connection with therapy, offered other resources, encouraged her to come back for  her physical in a follow-up visit prior to the holidays, there would likely be increased grief and worse symptoms during that time. PHQ-9 and GAD-7 reviewed Last OV A&P: Pt with unexpected and sudden loss of her husband in Dec (upon further chart review - her husband was my patient and he was brought to the ED DOA - no ER record so likely DOA at home - pt said she doesn't know why he died.  It looks like the case did not go to ME, unfortunately the death certificate ended up being signed later by my SP since I was out of office on FMLA)  Pt just started to work with a therapist -appointments, instructed to contact PCP to discuss possible medications She has not yet had a follow-up appointment Patient has no history of anxiety or depression and there is no family history of mental health disease other diagnoses She is mostly feeling down and depressed, having excessive sadness, lack of motivation, decreased energy, hard to get out of bed, sometimes at night she has difficulty sleep being, she endorses increased moodiness She has been able to go to work and continue working  She is very tearful during OV I explained adjustment disorder, sx, expected grief and loss sx - supportive listening Encouraged her to continue working with therapists - closely initially and also anticipate more visits needed for support with anniversaries Reviewed medication options that may help with most extreme sx  I've explained to her that drugs of the SSRI class can have side effects such as weight gain, sexual dysfunction, insomnia, headache, nausea. These medications are generally effective at alleviating symptoms of anxiety and/or depression.  With experiencing menopausal symptoms including hot flashes and sweats -I  explained that we could try Effexor as a SNRI that may also help minimize her menopausal syndrome symptoms she did just start hormonal placement therapy with Dr. Amalia Hailey she has not noticed a improvement in her symptoms just yet We will start low-dose Effexor anticipate increasing this dose over the next month, close follow-up, explained expected side effects particularly in the first week which should resolve in 1 to 2 weeks and explained that she would likely not see the medication benefits until she is taking them for about 6 weeks continuously Hydroxyzine PRN for anxiety, crying/emotional fits, or insomnia  - venlafaxine XR (EFFEXOR XR) 37.5 MG 24 hr capsule; Take 1 capsule (37.5 mg total) by mouth daily with breakfast.  Dispense: 90 capsule; Refill: 0 - hydrOXYzine (VISTARIL) 25 MG capsule; Take 1 capsule (25 mg total) by mouth every 8 (eight) hours as needed (anxiety or at bedtime for insomnia).  Dispense: 30 capsule; Refill: 2  2. Grief reaction See above  - venlafaxine XR (EFFEXOR XR) 37.5 MG 24 hr capsule; Take 1 capsule (37.5 mg total) by mouth daily with breakfast.  Dispense: 90 capsule; Refill: 3  3. Postmenopausal syndrome No improvement with meds from OB/GYN and with the addition of venlafaxine - venlafaxine XR (EFFEXOR XR) 37.5 MG 24 hr capsule; Take 1 capsule (37.5 mg total) by mouth daily with breakfast.  Dispense: 90 capsule; Refill: 3  4. Motion sickness, initial encounter Concern for possible motion sickness she is going on a cruise, gave recommendation of over-the-counter Dramamine, also sent in meclizine, if she is anxious encouraged her to use the hydroxyzine if she is very nauseous encouraged her to use meclizine but warned her not to use them together   She will come in about 3 months to recheck her meds and  symptoms and to do her complete physical         No follow-ups on file.    Delsa Grana, PA-C 01/20/22 1:33 PM

## 2022-01-20 NOTE — Patient Instructions (Addendum)
Other referrals may be helpful for psychiatry/psychology or counseling if we need more help for you for managing this or for helping with forms for work.  Sometimes a specialist assessment and recommendations are accepted for work stuff like FMLA when ours may not go through.   3 month follow up  Please send me a message or get an appointment sooner if needed.

## 2022-03-29 ENCOUNTER — Telehealth: Payer: BC Managed Care – PPO | Admitting: Physician Assistant

## 2022-03-29 DIAGNOSIS — J069 Acute upper respiratory infection, unspecified: Secondary | ICD-10-CM

## 2022-03-29 MED ORDER — BENZONATATE 100 MG PO CAPS: 100.0000 mg | ORAL_CAPSULE | Freq: Three times a day (TID) | ORAL | 0 refills | Status: DC | PRN

## 2022-03-29 MED ORDER — FLUTICASONE PROPIONATE 50 MCG/ACT NA SUSP: 2.0000 | Freq: Every day | NASAL | 0 refills | Status: DC

## 2022-03-29 NOTE — Progress Notes (Signed)
I have spent 5 minutes in review of e-visit questionnaire, review and updating patient chart, medical decision making and response to patient.   Candon Caras Cody Maylani Embree, PA-C    

## 2022-03-29 NOTE — Progress Notes (Signed)

## 2022-04-22 ENCOUNTER — Ambulatory Visit: Payer: BC Managed Care – PPO | Admitting: Family Medicine

## 2022-04-22 DIAGNOSIS — Z23 Encounter for immunization: Secondary | ICD-10-CM

## 2022-04-22 DIAGNOSIS — F4321 Adjustment disorder with depressed mood: Secondary | ICD-10-CM

## 2022-06-01 ENCOUNTER — Telehealth: Payer: BC Managed Care – PPO | Admitting: Physician Assistant

## 2022-06-01 DIAGNOSIS — R6889 Other general symptoms and signs: Secondary | ICD-10-CM

## 2022-06-01 MED ORDER — OSELTAMIVIR PHOSPHATE 75 MG PO CAPS: 75.0000 mg | ORAL_CAPSULE | Freq: Two times a day (BID) | ORAL | 0 refills | Status: DC

## 2022-06-01 NOTE — Progress Notes (Signed)
E visit for Flu like symptoms   We are sorry that you are not feeling well.  Here is how we plan to help! Based on what you have shared with me it looks like you may have a respiratory virus that may be influenza.  Influenza or "the flu" is   an infection caused by a respiratory virus. The flu virus is highly contagious and persons who did not receive their yearly flu vaccination may "catch" the flu from close contact.  We have anti-viral medications to treat the viruses that cause this infection. They are not a "cure" and only shorten the course of the infection. These prescriptions are most effective when they are given within the first 2 days of "flu" symptoms. Antiviral medication are indicated if you have a high risk of complications from the flu. You should  also consider an antiviral medication if you are in close contact with someone who is at risk. These medications can help patients avoid complications from the flu  but have side effects that you should know. Possible side effects from Tamiflu or oseltamivir include nausea, vomiting, diarrhea, dizziness, headaches, eye redness, sleep problems or other respiratory symptoms. You should not take Tamiflu if you have an allergy to oseltamivir or any to the ingredients in Tamiflu.  Based upon your symptoms and potential risk factors I have prescribed Oseltamivir (Tamiflu).  It has been sent to your designated pharmacy.  You will take one 75 mg capsule orally twice a day for the next 5 days. and I recommend that you follow the flu symptoms recommendation that I have listed below.  Please keep well-hydrated and try to get plenty of rest. If you have a humidifier, place it in the bedroom and run it at night. Start a saline nasal rinse for nasal congestion. You can consider use of a nasal steroid spray like Flonase or Nasacort OTC. You can alternate between Tylenol and Ibuprofen if needed for fever, body aches, headache and/or throat pain. Salt  water-gargles and chloraseptic spray can be very beneficial for sore throat. Mucinex-DM for congestion or cough. Please take all prescribed medications as directed.  Remain out of work until fever-free for 24 hours without a fever-reducing medication, and you are feeling better.  You should mask until symptoms are resolved.  If anything worsens despite treatment, you need to be evaluated in-person. Please do not delay care.   ANYONE WHO HAS FLU SYMPTOMS SHOULD: Stay home. The flu is highly contagious and going out or to work exposes others! Be sure to drink plenty of fluids. Water is fine as well as fruit juices, sodas and electrolyte beverages. You may want to stay away from caffeine or alcohol. If you are nauseated, try taking small sips of liquids. How do you know if you are getting enough fluid? Your urine should be a pale yellow or almost colorless. Get rest. Taking a steamy shower or using a humidifier may help nasal congestion and ease sore throat pain. Using a saline nasal spray works much the same way. Cough drops, hard candies and sore throat lozenges may ease your cough. Line up a caregiver. Have someone check on you regularly.   GET HELP RIGHT AWAY IF: You cannot keep down liquids or your medications. You become short of breath Your fell like you are going to pass out or loose consciousness. Your symptoms persist after you have completed your treatment plan MAKE SURE YOU  Understand these instructions. Will watch your condition. Will get help right   away if you are not doing well or get worse.  Your e-visit answers were reviewed by a board certified advanced clinical practitioner to complete your personal care plan.  Depending on the condition, your plan could have included both over the counter or prescription medications.  If there is a problem please reply  once you have received a response from your provider.  Your safety is important to us.  If you have drug allergies  check your prescription carefully.    You can use MyChart to ask questions about today's visit, request a non-urgent call back, or ask for a work or school excuse for 24 hours related to this e-Visit. If it has been greater than 24 hours you will need to follow up with your provider, or enter a new e-Visit to address those concerns.  You will get an e-mail in the next two days asking about your experience.  I hope that your e-visit has been valuable and will speed your recovery. Thank you for using e-visits.  

## 2022-06-01 NOTE — Progress Notes (Signed)
I have spent 5 minutes in review of e-visit questionnaire, review and updating patient chart, medical decision making and response to patient.   Rahma Meller Cody Heavyn Yearsley, PA-C    

## 2022-09-22 ENCOUNTER — Telehealth: Payer: BC Managed Care – PPO | Admitting: Nurse Practitioner

## 2022-09-22 DIAGNOSIS — N898 Other specified noninflammatory disorders of vagina: Secondary | ICD-10-CM

## 2022-09-22 MED ORDER — FLUCONAZOLE 150 MG PO TABS: 150.0000 mg | ORAL_TABLET | Freq: Once | ORAL | 0 refills | Status: AC

## 2022-09-22 NOTE — Progress Notes (Signed)

## 2023-01-26 ENCOUNTER — Telehealth: Payer: BC Managed Care – PPO | Admitting: Nurse Practitioner

## 2023-01-26 DIAGNOSIS — U071 COVID-19: Secondary | ICD-10-CM

## 2023-01-26 MED ORDER — BENZONATATE 100 MG PO CAPS: 100.0000 mg | ORAL_CAPSULE | Freq: Two times a day (BID) | ORAL | 0 refills | Status: DC | PRN

## 2023-01-26 MED ORDER — FLUTICASONE PROPIONATE 50 MCG/ACT NA SUSP: 2.0000 | Freq: Every day | NASAL | 6 refills | Status: DC

## 2023-01-26 NOTE — Progress Notes (Signed)
E-Visit  for Positive Covid Test Result   We are sorry you are not feeling well. We are here to help!  You have tested positive for COVID-19, meaning that you were infected with the novel coronavirus and could give the virus to others.  Most people with COVID-19 have mild illness and can recover at home without medical care. Do not leave your home, except to get medical care. Do not visit public areas and do not go to places where you are unable to wear a mask. It is important that you stay home  to take care for yourself and to help protect other people in your home and community.      Isolation Instructions:   You are to isolate at home until you have been fever free for at least 24 hours without a fever-reducing medication, and symptoms have been steadily improving for 24 hours. At that time,  you can end isolation but need to mask for an additional 5 days.  If you must be around other household members who do not have symptoms, you need to make sure that both you and the family members are masking consistently with a high-quality mask.  If you note any worsening of symptoms despite treatment, please seek an in-person evaluation ASAP. If you note any significant shortness of breath or any chest pain, please seek ER evaluation. Please do not delay care!   Go to the nearest hospital ED for assessment if fever/cough/breathlessness are severe or illness seems like a threat to life.    The following symptoms may appear 2-14 days after exposure: Fever Cough Shortness of breath or difficulty breathing Chills Repeated shaking with chills Muscle pain Headache Sore throat New loss of taste or smell Fatigue Congestion or runny nose Nausea or vomiting Diarrhea  You can use medication such as prescription cough medication called Tessalon Perles 100 mg. You may take 1-2 capsules every 8 hours as needed for cough and prescription for Fluticasone nasal spray 2 sprays in each nostril one time  per day  You may also take acetaminophen (Tylenol) as needed for fever.  HOME CARE: Only take medications as instructed by your medical team. Drink plenty of fluids and get plenty of rest. A steam or ultrasonic humidifier can help if you have congestion.   GET HELP RIGHT AWAY IF YOU HAVE EMERGENCY WARNING SIGNS.  Call 911 or proceed to your closest emergency facility if: You develop worsening high fever. Trouble breathing Bluish lips or face Persistent pain or pressure in the chest New confusion Inability to wake or stay awake You cough up blood. Your symptoms become more severe Inability to hold down food or fluids  This list is not all possible symptoms. Contact your medical provider for any symptoms that are severe or concerning to you.   Your e-visit answers were reviewed by a board certified advanced clinical practitioner to complete your personal care plan.  Depending on the condition, your plan could have included both over the counter or prescription medications.  If there is a problem please reply once you have received a response from your provider.  Your safety is important to [place].  If you have drug allergies check your prescription carefully.    You can use MyChart to ask questions about today's visit, request a non-urgent call back, or ask for a work or school excuse for 24 hours related to this e-Visit. If it has been greater than 24 hours you will need to follow up with your  provider, or enter a new e-Visit to address those concerns. You will get an e-mail in the next two days asking about your experience.  I hope that your e-visit has been valuable and will speed your recovery. Thank you for using e-visits.  Meds ordered this encounter  Medications   benzonatate (TESSALON) 100 MG capsule    Sig: Take 1 capsule (100 mg total) by mouth 2 (two) times daily as needed for cough.    Dispense:  20 capsule    Refill:  0   fluticasone (FLONASE) 50 MCG/ACT nasal spray     Sig: Place 2 sprays into both nostrils daily.    Dispense:  16 g    Refill:  6    I spent approximately 5 minutes reviewing the patient's history, current symptoms and coordinating their care today.

## 2023-01-31 ENCOUNTER — Telehealth: Payer: BC Managed Care – PPO | Admitting: Nurse Practitioner

## 2023-01-31 DIAGNOSIS — J40 Bronchitis, not specified as acute or chronic: Secondary | ICD-10-CM

## 2023-01-31 MED ORDER — AZITHROMYCIN 250 MG PO TABS: ORAL_TABLET | ORAL | 0 refills | Status: AC

## 2023-01-31 MED ORDER — PREDNISONE 20 MG PO TABS: 20.0000 mg | ORAL_TABLET | Freq: Two times a day (BID) | ORAL | 0 refills | Status: AC

## 2023-01-31 NOTE — Progress Notes (Signed)
E-Visit for Cough  We are sorry that you are not feeling well.  Here is how we plan to help!  Based on your presentation I believe you most likely have A cough due to bacteria.  When patients have a fever and a productive cough with a change in color or increased sputum production, we are concerned about bacterial bronchitis.  If left untreated it can progress to pneumonia.  If your symptoms do not improve with your treatment plan it is important that you contact your provider.   I have prescribed Azithromyin 250 mg: two tablets now and then one tablet daily for 4 additonal days     Prednisone 40 mg daily for 5 days take with food   From your responses in the eVisit questionnaire you describe inflammation in the upper respiratory tract which is causing a significant cough.  This is commonly called Bronchitis and has four common causes:   Allergies Viral Infections Acid Reflux Bacterial Infection Allergies, viruses and acid reflux are treated by controlling symptoms or eliminating the cause. An example might be a cough caused by taking certain blood pressure medications. You stop the cough by changing the medication. Another example might be a cough caused by acid reflux. Controlling the reflux helps control the cough.      HOME CARE Only take medications as instructed by your medical team. Complete the entire course of an antibiotic. Drink plenty of fluids and get plenty of rest. Avoid close contacts especially the very young and the elderly Cover your mouth if you cough or cough into your sleeve. Always remember to wash your hands A steam or ultrasonic humidifier can help congestion.   GET HELP RIGHT AWAY IF: You develop worsening fever. You become short of breath You cough up blood. Your symptoms persist after you have completed your treatment plan MAKE SURE YOU  Understand these instructions. Will watch your condition. Will get help right away if you are not doing well or get  worse.    Thank you for choosing an e-visit.  Your e-visit answers were reviewed by a board certified advanced clinical practitioner to complete your personal care plan. Depending upon the condition, your plan could have included both over the counter or prescription medications.  Please review your pharmacy choice. Make sure the pharmacy is open so you can pick up prescription now. If there is a problem, you may contact your provider through [employer] and have the prescription routed to another pharmacy.  Your safety is important to [place]. If you have drug allergies check your prescription carefully.   For the next 24 hours you can use MyChart to ask questions about today's visit, request a non-urgent call back, or ask for a work or school excuse. You will get an email in the next two days asking about your experience. I hope that your e-visit has been valuable and will speed your recovery.   Meds ordered this encounter  Medications   predniSONE (DELTASONE) 20 MG tablet    Sig: Take 1 tablet (20 mg total) by mouth 2 (two) times daily with a meal for 5 days.    Dispense:  10 tablet    Refill:  0   azithromycin (ZITHROMAX) 250 MG tablet    Sig: Take 2 tablets on day 1, then 1 tablet daily on days 2 through 5    Dispense:  6 tablet    Refill:  0    I spent approximately 5 minutes reviewing the patient's history,  current symptoms and coordinating their care today.

## 2023-05-09 ENCOUNTER — Telehealth: Payer: BC Managed Care – PPO | Admitting: Physician Assistant

## 2023-05-09 DIAGNOSIS — J069 Acute upper respiratory infection, unspecified: Secondary | ICD-10-CM

## 2023-05-09 MED ORDER — NAPROXEN 500 MG PO TABS: 500.0000 mg | ORAL_TABLET | Freq: Two times a day (BID) | ORAL | 0 refills | Status: DC

## 2023-05-09 MED ORDER — PSEUDOEPH-BROMPHEN-DM 30-2-10 MG/5ML PO SYRP: 5.0000 mL | ORAL_SOLUTION | Freq: Four times a day (QID) | ORAL | 0 refills | Status: DC | PRN

## 2023-05-09 NOTE — Progress Notes (Signed)
E-Visit for Upper Respiratory Infection   We are sorry you are not feeling well.  Here is how we plan to help!  Based on what you have shared with me, it looks like you may have a viral upper respiratory infection.  Upper respiratory infections are caused by a large number of viruses; however, rhinovirus is the most common cause.   Symptoms vary from person to person, with common symptoms including sore throat, cough, fatigue or lack of energy and feeling of general discomfort.  A low-grade fever of up to 100.4 may present, but is often uncommon.  Symptoms vary however, and are closely related to a person's age or underlying illnesses.  The most common symptoms associated with an upper respiratory infection are nasal discharge or congestion, cough, sneezing, headache and pressure in the ears and face.  These symptoms usually persist for about 3 to 10 days, but can last up to 2 weeks.  It is important to know that upper respiratory infections do not cause serious illness or complications in most cases.    Upper respiratory infections can be transmitted from person to person, with the most common method of transmission being a person's hands.  The virus is able to live on the skin and can infect other persons for up to 2 hours after direct contact.  Also, these can be transmitted when someone coughs or sneezes; thus, it is important to cover the mouth to reduce this risk.  To keep the spread of the illness at bay, good hand hygiene is very important.  This is an infection that is most likely caused by a virus. There are no specific treatments other than to help you with the symptoms until the infection runs its course.  We are sorry you are not feeling well.  Here is how we plan to help!   For nasal congestion, you may use an oral decongestants such as Mucinex D or if you have glaucoma or high blood pressure use plain Mucinex.  Saline nasal spray or nasal drops can help and can safely be used as often as  needed for congestion.   If you do not have a history of heart disease, hypertension, diabetes or thyroid disease, prostate/bladder issues or glaucoma, you may also use Sudafed to treat nasal congestion.  It is highly recommended that you consult with a pharmacist or your primary care physician to ensure this medication is safe for you to take.     For cough I have prescribed for you Bromfed DM cough syrup Use 5 mL every 6 hours as needed for cough and congestion. You can take PLAIN Mucinex with this if needed for drainage and congestion.   If you have a sore or scratchy throat, use a saltwater gargle-  to  teaspoon of salt dissolved in a 4-ounce to 8-ounce glass of warm water.  Gargle the solution for approximately 15-30 seconds and then spit.  It is important not to swallow the solution.  You can also use throat lozenges/cough drops and Chloraseptic spray to help with throat pain or discomfort.  Warm or cold liquids can also be helpful in relieving throat pain.  For headache, pain or general discomfort, you can use Ibuprofen or Tylenol as directed.    I will prescribe Naproxen 500mg  Take 1 tablet twice daily as needed for pain and inflammation.   Some authorities believe that zinc sprays or the use of Echinacea may shorten the course of your symptoms.   HOME CARE Only take  medications as instructed by your medical team. Be sure to drink plenty of fluids. Water is fine as well as fruit juices, sodas and electrolyte beverages. You may want to stay away from caffeine or alcohol. If you are nauseated, try taking small sips of liquids. How do you know if you are getting enough fluid? Your urine should be a pale yellow or almost colorless. Get rest. Taking a steamy shower or using a humidifier may help nasal congestion and ease sore throat pain. You can place a towel over your head and breathe in the steam from hot water coming from a faucet. Using a saline nasal spray works much the same  way. Cough drops, hard candies and sore throat lozenges may ease your cough. Avoid close contacts especially the very young and the elderly Cover your mouth if you cough or sneeze Always remember to wash your hands.   GET HELP RIGHT AWAY IF: You develop worsening fever. If your symptoms do not improve within 10 days You develop yellow or green discharge from your nose over 3 days. You have coughing fits You develop a severe head ache or visual changes. You develop shortness of breath, difficulty breathing or start having chest pain Your symptoms persist after you have completed your treatment plan  MAKE SURE YOU  Understand these instructions. Will watch your condition. Will get help right away if you are not doing well or get worse.  Thank you for choosing an e-visit.  Your e-visit answers were reviewed by a board certified advanced clinical practitioner to complete your personal care plan. Depending upon the condition, your plan could have included both over the counter or prescription medications.  Please review your pharmacy choice. Make sure the pharmacy is open so you can pick up prescription now. If there is a problem, you may contact your provider through [employer] and have the prescription routed to another pharmacy.  Your safety is important to [place]. If you have drug allergies check your prescription carefully.   For the next 24 hours you can use MyChart to ask questions about today's visit, request a non-urgent call back, or ask for a work or school excuse. You will get an email in the next two days asking about your experience. I hope that your e-visit has been valuable and will speed your recovery.     I have spent 5 minutes in review of e-visit questionnaire, review and updating patient chart, medical decision making and response to patient.   Margaretann Loveless, PA-C

## 2023-06-06 ENCOUNTER — Telehealth: Payer: BC Managed Care – PPO | Admitting: Physician Assistant

## 2023-06-06 DIAGNOSIS — J069 Acute upper respiratory infection, unspecified: Secondary | ICD-10-CM

## 2023-06-06 MED ORDER — PROMETHAZINE-DM 6.25-15 MG/5ML PO SYRP: 5.0000 mL | ORAL_SOLUTION | Freq: Four times a day (QID) | ORAL | 0 refills | Status: DC | PRN

## 2023-06-06 MED ORDER — NAPROXEN 500 MG PO TABS: 500.0000 mg | ORAL_TABLET | Freq: Two times a day (BID) | ORAL | 0 refills | Status: DC

## 2023-06-06 MED ORDER — FLUTICASONE PROPIONATE 50 MCG/ACT NA SUSP: 2.0000 | Freq: Every day | NASAL | 0 refills | Status: DC

## 2023-06-06 NOTE — Progress Notes (Signed)
E-Visit for Upper Respiratory Infection   We are sorry you are not feeling well.  Here is how we plan to help!  Based on what you have shared with me, it looks like you may have a viral upper respiratory infection.  Upper respiratory infections are caused by a large number of viruses; however, rhinovirus is the most common cause.   Symptoms vary from person to person, with common symptoms including sore throat, cough, fatigue or lack of energy and feeling of general discomfort.  A low-grade fever of up to 100.4 may present, but is often uncommon.  Symptoms vary however, and are closely related to a person's age or underlying illnesses.  The most common symptoms associated with an upper respiratory infection are nasal discharge or congestion, cough, sneezing, headache and pressure in the ears and face.  These symptoms usually persist for about 3 to 10 days, but can last up to 2 weeks.  It is important to know that upper respiratory infections do not cause serious illness or complications in most cases.    Upper respiratory infections can be transmitted from person to person, with the most common method of transmission being a person's hands.  The virus is able to live on the skin and can infect other persons for up to 2 hours after direct contact.  Also, these can be transmitted when someone coughs or sneezes; thus, it is important to cover the mouth to reduce this risk.  To keep the spread of the illness at bay, good hand hygiene is very important.  This is an infection that is most likely caused by a virus. There are no specific treatments other than to help you with the symptoms until the infection runs its course.  We are sorry you are not feeling well.  Here is how we plan to help!   For nasal congestion, you may use an oral decongestants such as Mucinex D or if you have glaucoma or high blood pressure use plain Mucinex.  Saline nasal spray or nasal drops can help and can safely be used as often as  needed for congestion.  For your congestion, I have prescribed Fluticasone nasal spray one spray in each nostril twice a day  If you do not have a history of heart disease, hypertension, diabetes or thyroid disease, prostate/bladder issues or glaucoma, you may also use Sudafed to treat nasal congestion.  It is highly recommended that you consult with a pharmacist or your primary care physician to ensure this medication is safe for you to take.     If you have a cough, you may use cough suppressants such as Delsym and Robitussin.  If you have glaucoma or high blood pressure, you can also use Coricidin HBP.   For cough I have prescribed for you Promethazine DM Take 5mL every 6 hours as needed for cough  If you have a sore or scratchy throat, use a saltwater gargle-  to  teaspoon of salt dissolved in a 4-ounce to 8-ounce glass of warm water.  Gargle the solution for approximately 15-30 seconds and then spit.  It is important not to swallow the solution.  You can also use throat lozenges/cough drops and Chloraseptic spray to help with throat pain or discomfort.  Warm or cold liquids can also be helpful in relieving throat pain.  For headache, pain or general discomfort, you can use Tylenol as directed.  I have prescribed Naproxen 500mg  for body aches and sore throat.   Some authorities believe  that zinc sprays or the use of Echinacea may shorten the course of your symptoms.   HOME CARE Only take medications as instructed by your medical team. Be sure to drink plenty of fluids. Water is fine as well as fruit juices, sodas and electrolyte beverages. You may want to stay away from caffeine or alcohol. If you are nauseated, try taking small sips of liquids. How do you know if you are getting enough fluid? Your urine should be a pale yellow or almost colorless. Get rest. Taking a steamy shower or using a humidifier may help nasal congestion and ease sore throat pain. You can place a towel over your head  and breathe in the steam from hot water coming from a faucet. Using a saline nasal spray works much the same way. Cough drops, hard candies and sore throat lozenges may ease your cough. Avoid close contacts especially the very young and the elderly Cover your mouth if you cough or sneeze Always remember to wash your hands.   GET HELP RIGHT AWAY IF: You develop worsening fever. If your symptoms do not improve within 10 days You develop yellow or green discharge from your nose over 3 days. You have coughing fits You develop a severe head ache or visual changes. You develop shortness of breath, difficulty breathing or start having chest pain Your symptoms persist after you have completed your treatment plan  MAKE SURE YOU  Understand these instructions. Will watch your condition. Will get help right away if you are not doing well or get worse.  Thank you for choosing an e-visit.  Your e-visit answers were reviewed by a board certified advanced clinical practitioner to complete your personal care plan. Depending upon the condition, your plan could have included both over the counter or prescription medications.  Please review your pharmacy choice. Make sure the pharmacy is open so you can pick up prescription now. If there is a problem, you may contact your provider through [employer] and have the prescription routed to another pharmacy.  Your safety is important to [place]. If you have drug allergies check your prescription carefully.   For the next 24 hours you can use MyChart to ask questions about today's visit, request a non-urgent call back, or ask for a work or school excuse. You will get an email in the next two days asking about your experience. I hope that your e-visit has been valuable and will speed your recovery.    I have spent 5 minutes in review of e-visit questionnaire, review and updating patient chart, medical decision making and response to patient.   Margaretann Loveless, PA-C

## 2023-06-17 ENCOUNTER — Ambulatory Visit: Payer: BC Managed Care – PPO | Admitting: Podiatry

## 2023-09-27 ENCOUNTER — Telehealth: Admitting: Physician Assistant

## 2023-09-27 DIAGNOSIS — R0789 Other chest pain: Secondary | ICD-10-CM

## 2023-09-27 DIAGNOSIS — R12 Heartburn: Secondary | ICD-10-CM

## 2023-09-28 NOTE — Progress Notes (Signed)
  Because of chest pain associated with your heartburn/reflux and need for exam, I feel your condition warrants further evaluation and I recommend that you be seen in a face-to-face visit.   NOTE: There will be NO CHARGE for this E-Visit   If you are having a true medical emergency, please call 911.     For an urgent face to face visit, [place] has multiple urgent care centers for your convenience.  Click the link below for the full list of locations and hours, walk-in wait times, appointment scheduling options and driving directions:  Urgent Care - [place], [place], [place], [place], [place], [place]  [place]     Your MyChart E-visit questionnaire answers were reviewed by a board certified advanced clinical practitioner to complete your personal care plan based on your specific symptoms.    Thank you for using e-Visits.

## 2023-10-10 ENCOUNTER — Ambulatory Visit (INDEPENDENT_AMBULATORY_CARE_PROVIDER_SITE_OTHER): Admitting: Family Medicine

## 2023-10-10 DIAGNOSIS — Z1231 Encounter for screening mammogram for malignant neoplasm of breast: Secondary | ICD-10-CM

## 2023-10-10 DIAGNOSIS — Z0000 Encounter for general adult medical examination without abnormal findings: Secondary | ICD-10-CM

## 2023-10-10 DIAGNOSIS — Z124 Encounter for screening for malignant neoplasm of cervix: Secondary | ICD-10-CM

## 2023-10-10 DIAGNOSIS — Z91199 Patient's noncompliance with other medical treatment and regimen due to unspecified reason: Secondary | ICD-10-CM

## 2023-10-10 NOTE — Progress Notes (Signed)
    Patient: Dana Wolf, Female    DOB: Mar 19, 1971, 53 y.o.   MRN: [id number] Dana Hone, PA-C Visit Date: 10/10/2023  Today's Provider: Adeline Hone, PA-C   PT NO SHOWED   No chief complaint on file.  Subjective:   Annual physical exam:  Dana Wolf is a 53 y.o. female who presents today for complete physical exam:    ICD-10-CM   1. Well adult exam  Z00.00     2. Breast cancer screening by mammogram  Z12.31     3. Cervical cancer screening  Z12.4     4. No-show for appointment  Z91.199       Notice of no-show policy and request to reschedule sent to pt and note closed    Dana Hone, PA-C 10/10/23 8:46 AM  [facility]

## 2023-10-18 ENCOUNTER — Telehealth: Admitting: Family Medicine

## 2023-10-18 ENCOUNTER — Encounter: Payer: Self-pay | Admitting: Family Medicine

## 2023-10-18 DIAGNOSIS — R52 Pain, unspecified: Secondary | ICD-10-CM | POA: Diagnosis not present

## 2023-10-18 DIAGNOSIS — R6889 Other general symptoms and signs: Secondary | ICD-10-CM | POA: Diagnosis not present

## 2023-10-18 DIAGNOSIS — R051 Acute cough: Secondary | ICD-10-CM

## 2023-10-18 MED ORDER — PSEUDOEPH-BROMPHEN-DM 30-2-10 MG/5ML PO SYRP: 5.0000 mL | ORAL_SOLUTION | Freq: Four times a day (QID) | ORAL | 0 refills | Status: DC | PRN

## 2023-10-18 NOTE — Progress Notes (Signed)
 E visit for Flu like symptoms   We are sorry that you are not feeling well.  Here is how we plan to help! Based on what you have shared with me it looks like you may have a respiratory virus that may be influenza.  Influenza or "the flu" is   an infection caused by a respiratory virus. The flu virus is highly contagious and persons who did not receive their yearly flu vaccination may "catch" the flu from close contact.  I will order you a cough syrup to help with several of your symptoms. Tylenol and or Ibuprofen would be best to use if having aches and pains.    ANYONE WHO HAS FLU SYMPTOMS SHOULD: Stay home. The flu is highly contagious and going out or to work exposes others! Be sure to drink plenty of fluids. Water is fine as well as fruit juices, sodas and electrolyte beverages. You may want to stay away from caffeine or alcohol. If you are nauseated, try taking small sips of liquids. How do you know if you are getting enough fluid? Your urine should be a pale yellow or almost colorless. Get rest. Taking a steamy shower or using a humidifier may help nasal congestion and ease sore throat pain. Using a saline nasal spray works much the same way. Cough drops, hard candies and sore throat lozenges may ease your cough. Line up a caregiver. Have someone check on you regularly.   GET HELP RIGHT AWAY IF: You cannot keep down liquids or your medications. You become short of breath Your fell like you are going to pass out or loose consciousness. Your symptoms persist after you have completed your treatment plan MAKE SURE YOU  Understand these instructions. Will watch your condition. Will get help right away if you are not doing well or get worse.  Your e-visit answers were reviewed by a board certified advanced clinical practitioner to complete your personal care plan.  Depending on the condition, your plan could have included both over the counter or prescription medications.  If there is  a problem please reply  once you have received a response from your provider.  Your safety is important to us .  If you have drug allergies check your prescription carefully.    You can use MyChart to ask questions about today's visit, request a non-urgent call back, or ask for a work or school excuse for 24 hours related to this e-Visit. If it has been greater than 24 hours you will need to follow up with your provider, or enter a new e-Visit to address those concerns.  You will get an e-mail in the next two days asking about your experience.  I hope that your e-visit has been valuable and will speed your recovery. Thank you for using e-visits.  I provided 5 minutes of non face-to-face time during this encounter for chart review, medication and order placement, as well as and documentation.

## 2023-10-26 ENCOUNTER — Other Ambulatory Visit (HOSPITAL_COMMUNITY)
Admission: RE | Admit: 2023-10-26 | Discharge: 2023-10-26 | Disposition: A | Discharge status: Home / Self Care | Source: Ambulatory Visit | Attending: Family Medicine | Admitting: Family Medicine

## 2023-10-26 ENCOUNTER — Ambulatory Visit: Admitting: Family Medicine

## 2023-10-26 VITALS — BP 120/74 | HR 82 | Resp 16 | Ht 64.0 in | Wt 178.0 lb

## 2023-10-26 DIAGNOSIS — E66811 Obesity, class 1: Secondary | ICD-10-CM | POA: Diagnosis not present

## 2023-10-26 DIAGNOSIS — Z113 Encounter for screening for infections with a predominantly sexual mode of transmission: Secondary | ICD-10-CM | POA: Insufficient documentation

## 2023-10-26 DIAGNOSIS — Z6830 Body mass index (BMI) 30.0-30.9, adult: Secondary | ICD-10-CM

## 2023-10-26 DIAGNOSIS — K219 Gastro-esophageal reflux disease without esophagitis: Secondary | ICD-10-CM | POA: Diagnosis not present

## 2023-10-26 DIAGNOSIS — R87620 Atypical squamous cells of undetermined significance on cytologic smear of vagina (ASC-US): Secondary | ICD-10-CM

## 2023-10-26 DIAGNOSIS — Z1231 Encounter for screening mammogram for malignant neoplasm of breast: Secondary | ICD-10-CM

## 2023-10-26 DIAGNOSIS — Z1211 Encounter for screening for malignant neoplasm of colon: Secondary | ICD-10-CM

## 2023-10-26 DIAGNOSIS — N760 Acute vaginitis: Secondary | ICD-10-CM | POA: Insufficient documentation

## 2023-10-26 DIAGNOSIS — B3731 Acute candidiasis of vulva and vagina: Secondary | ICD-10-CM | POA: Diagnosis not present

## 2023-10-26 DIAGNOSIS — Z0000 Encounter for general adult medical examination without abnormal findings: Secondary | ICD-10-CM

## 2023-10-26 DIAGNOSIS — Z1159 Encounter for screening for other viral diseases: Secondary | ICD-10-CM | POA: Diagnosis not present

## 2023-10-26 DIAGNOSIS — R7303 Prediabetes: Secondary | ICD-10-CM | POA: Diagnosis not present

## 2023-10-26 DIAGNOSIS — N951 Menopausal and female climacteric states: Secondary | ICD-10-CM

## 2023-10-26 DIAGNOSIS — Z124 Encounter for screening for malignant neoplasm of cervix: Secondary | ICD-10-CM | POA: Diagnosis not present

## 2023-10-26 DIAGNOSIS — B9689 Other specified bacterial agents as the cause of diseases classified elsewhere: Secondary | ICD-10-CM | POA: Diagnosis not present

## 2023-10-26 DIAGNOSIS — Z0001 Encounter for general adult medical examination with abnormal findings: Secondary | ICD-10-CM

## 2023-10-26 DIAGNOSIS — F4321 Adjustment disorder with depressed mood: Secondary | ICD-10-CM

## 2023-10-26 MED ORDER — PANTOPRAZOLE SODIUM 40 MG PO TBEC: 40.0000 mg | DELAYED_RELEASE_TABLET | Freq: Every day | ORAL | 1 refills | Status: AC

## 2023-10-26 NOTE — Progress Notes (Signed)
 Patient: Dana Wolf, Female    DOB: 29-Sep-1970, 53 y.o.   MRN: [id number] Dana Hone, PA-C Visit Date: 10/26/2023  Today's Provider: Adeline Hone, PA-C   Chief Complaint  Patient presents with   Annual Exam   Subjective:   Annual physical exam:  Dana Wolf is a 53 y.o. female who presents today for complete physical exam:  Exercise/Activity:   very active at work walking a lot Diet/nutrition:  trying to eat healthy cut back on stuff, eat salads, don't eat after certain  Sleep: no problems  SDOH Screenings   Food Insecurity: No Food Insecurity (10/26/2023)  Housing: High Risk (10/26/2023)  Transportation Needs: No Transportation Needs (10/26/2023)  Utilities: Not At Risk (10/26/2023)  Alcohol Screen: Low Risk  (10/26/2023)  Depression (PHQ2-9): Medium Risk (10/26/2023)  Financial Resource Strain: Medium Risk (10/26/2023)  Physical Activity: Sufficiently Active (10/26/2023)  Social Connections: Moderately Isolated (10/26/2023)  Stress: Stress Concern Present (10/26/2023)  Tobacco Use: Low Risk  (10/26/2023)  Health Literacy: Adequate Health Literacy (10/26/2023)     USPSTF grade A and B recommendations - reviewed and addressed today  Depression:  Phq 9 completed today by patient, was reviewed by me with patient in the room PHQ score is positive, mild depressive sx    10/26/2023    2:41 PM 01/20/2022    2:03 PM 12/16/2021    2:36 PM 09/14/2021    1:23 PM  PHQ 2/9 Scores  PHQ - 2 Score 2 4 6 2   PHQ- 9 Score 6 9 21 4       10/26/2023    2:41 PM 01/20/2022    2:03 PM 12/16/2021    2:36 PM 09/14/2021    1:23 PM 03/17/2021    9:15 AM  Depression screen PHQ 2/9  Decreased Interest 1 2 3 1  0  Down, Depressed, Hopeless 1 2 3 1  0  PHQ - 2 Score 2 4 6 2  0  Altered sleeping 0 2 3 1    Tired, decreased energy 3 2 3 1    Change in appetite 1 1 3  0   Feeling bad or failure about yourself  0 0 3 0   Trouble concentrating 0 0 3 0   Moving slowly or fidgety/restless 0 0 0 0    Suicidal thoughts 0 0 0 0   PHQ-9 Score 6 9 21 4    Difficult doing work/chores  Somewhat difficult Very difficult Somewhat difficult     Alcohol screening: Flowsheet Row Office Visit from 10/26/2023 in [facility]  AUDIT-C Score 3        Immunizations and Health Maintenance: Health Maintenance  Topic Date Due   Colonoscopy  Never done   Cervical Cancer Screening (Pap smear)  11/13/2022   MAMMOGRAM  12/24/2022   COVID-19 Vaccine (4 - 2024-25 season) 11/11/2023 (Originally 02/06/2023)   Zoster Vaccines- Shingrix (1 of 2) 01/26/2024 (Originally 12/24/2022)   DTaP/Tdap/Td (1 - Tdap) 10/25/2024 (Originally 12/24/1991)   Hepatitis C Screening  10/25/2024 (Originally 12/24/1990)   INFLUENZA VACCINE  01/06/2024   HIV Screening  Completed   HPV VACCINES  Aged Out   Meningococcal B Vaccine  Aged Out     Hep C Screening: due  STD testing and prevention (HIV/chl/gon/syphilis):  see above, no additional testing desired by pt today  - new partner   Intimate partner violence:  safe at home, son at home with her  Sexual History/Pain during Intercourse: Widowed  Menstrual History/LMP/Abnormal Bleeding:  No LMP recorded. (  Menstrual status: Perimenopausal).  Incontinence Symptoms:  None   Breast cancer: due Last Mammogram: *see HM list above BRCA gene screening: none  Cervical cancer screening: due Pt family hx of cancers - breast, ovarian, uterine, colon:     Osteoporosis:   Discussion on osteoporosis per age, including high calcium and vitamin D supplementation, weight bearing exercises  Skin cancer:  Hx of skin CA -  NO Discussed atypical lesions  A lot of lesions   Colorectal cancer:   Colonoscopy is due Discussed concerning signs and sx of CRC, pt denies melena, hematochezia, change in BM pattern or caliber    Lung cancer:   Low Dose CT Chest recommended if Age 48-80 years, 20 pack-year currently smoking OR have quit w/in 15years. Patient does  not qualify.    Social History   Tobacco Use   Smoking status: Never   Smokeless tobacco: Never  Vaping Use   Vaping status: Never Used  Substance Use Topics   Alcohol use: Yes    Alcohol/week: 3.0 standard drinks of alcohol    Types: 3 Shots of liquor per week    Comment: occassionally   Drug use: No     Flowsheet Row Office Visit from 10/26/2023 in [facility]  AUDIT-C Score 3        Family History  Adopted: Yes  Problem Relation Age of Onset   Breast cancer Neg Hx    Ovarian cancer Neg Hx    Colon cancer Neg Hx      Blood pressure/Hypertension: BP Readings from Last 3 Encounters:  10/26/23 120/74  01/20/22 110/68  12/16/21 112/70    Weight/Obesity: Wt Readings from Last 3 Encounters:  10/26/23 178 lb (80.7 kg)  01/20/22 150 lb 14.4 oz (68.4 kg)  12/16/21 149 lb 8 oz (67.8 kg)   BMI Readings from Last 3 Encounters:  10/26/23 30.55 kg/m  01/20/22 25.90 kg/m  12/16/21 25.66 kg/m     Lipids:  Lab Results  Component Value Date   CHOL 158 11/12/2021   CHOL 179 04/05/2019   CHOL 146 08/02/2016   Lab Results  Component Value Date   HDL 52 11/12/2021   HDL 53 04/05/2019   HDL 48 (L) 08/02/2016   Lab Results  Component Value Date   LDLCALC 91 11/12/2021   LDLCALC 105 (H) 04/05/2019   LDLCALC 82 08/02/2016   Lab Results  Component Value Date   TRIG 79 11/12/2021   TRIG 117 04/05/2019   TRIG 79 08/02/2016   Lab Results  Component Value Date   CHOLHDL 3.0 11/12/2021   CHOLHDL 3.4 04/05/2019   CHOLHDL 3.0 08/02/2016   No results found for: "LDLDIRECT" Based on the results of lipid panel his/her cardiovascular risk factor ( using Poole Cohort )  in the next 10 years is: The 10-year ASCVD risk score (Arnett DK, et al., 2019) is: 1.3%   Values used to calculate the score:     Age: 35 years     Sex: Female     Is Non-Hispanic African American: Yes     Diabetic: No     Tobacco smoker: No     Systolic Blood  Pressure: 120 mmHg     Is BP treated: No     HDL Cholesterol: 52 mg/dL     Total Cholesterol: 158 mg/dL  Glucose:  Glucose  Date Value Ref Range Status  11/12/2021 88 70 - 99 mg/dL Final   Glucose, Bld  Date Value Ref Range  Status  04/05/2019 94 65 - 99 mg/dL Final    Comment:    .            Fasting reference interval .   08/02/2016 91 65 - 99 mg/dL Final    Social History       Social History   Socioeconomic History   Marital status: Widowed    Spouse name: Ashelyn Mccravy   Number of children: Not on file   Years of education: Not on file   Highest education level: Some college, no degree  Occupational History   Not on file  Tobacco Use   Smoking status: Never   Smokeless tobacco: Never  Vaping Use   Vaping status: Never Used  Substance and Sexual Activity   Alcohol use: Yes    Alcohol/week: 3.0 standard drinks of alcohol    Types: 3 Shots of liquor per week    Comment: occassionally   Drug use: No   Sexual activity: Yes    Birth control/protection: Surgical    Comment: tubal ligation   Other Topics Concern   Not on file  Social History Narrative   Not on file   Social Drivers of Health   Financial Resource Strain: Medium Risk (10/26/2023)   Overall Financial Resource Strain (CARDIA)    Difficulty of Paying Living Expenses: Somewhat hard  Food Insecurity: No Food Insecurity (10/26/2023)   Hunger Vital Sign    Worried About Running Out of Food in the Last Year: Never true    Ran Out of Food in the Last Year: Never true  Transportation Needs: No Transportation Needs (10/26/2023)   PRAPARE - Administrator, Civil Service (Medical): No    Lack of Transportation (Non-Medical): No  Physical Activity: Sufficiently Active (10/26/2023)   Exercise Vital Sign    Days of Exercise per Week: 5 days    Minutes of Exercise per Session: 30 min  Stress: Stress Concern Present (10/26/2023)   [employer] of Occupational Health - Occupational Stress  Questionnaire    Feeling of Stress : To some extent  Social Connections: Moderately Isolated (10/26/2023)   Social Connection and Isolation Panel [NHANES]    Frequency of Communication with Friends and Family: More than three times a week    Frequency of Social Gatherings with Friends and Family: Three times a week    Attends Religious Services: More than 4 times per year    Active Member of Clubs or Organizations: No    Attends Banker Meetings: Not on file    Marital Status: Widowed    Family History        Family History  Adopted: Yes  Problem Relation Age of Onset   Breast cancer Neg Hx    Ovarian cancer Neg Hx    Colon cancer Neg Hx     Patient Active Problem List   Diagnosis Date Noted   Adjustment disorder with depressed mood 12/16/2021   Grief reaction 12/16/2021   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/2021   ASCUS with positive high risk HPV cervical 08/04/2016    Past Surgical History:  Procedure Laterality Date   COLPOSCOPY     TUBAL LIGATION       Current Outpatient Medications:    brompheniramine-pseudoephedrine-DM 30-2-10 MG/5ML syrup, Take 5 mLs by mouth 4 (four) times daily as needed. (Patient not taking: Reported on 10/26/2023), Disp: 120 mL, Rfl: 0   estradiol -norethindrone  (ACTIVELLA) 1-0.5 MG tablet, Take 1 tablet by mouth daily., Disp: 90 tablet, Rfl:  3   fluticasone  (FLONASE ) 50 MCG/ACT nasal spray, Place 2 sprays into both nostrils daily. (Patient not taking: Reported on 10/26/2023), Disp: 16 g, Rfl: 0   hydrOXYzine  (VISTARIL ) 25 MG capsule, Take 1 capsule (25 mg total) by mouth every 8 (eight) hours as needed (anxiety or at bedtime for insomnia). (Patient not taking: Reported on 10/26/2023), Disp: 30 capsule, Rfl: 2   meclizine  (ANTIVERT ) 25 MG tablet, Take 0.5-1 tablets (12.5-25 mg total) by mouth 2 (two) times daily as needed for dizziness or nausea. (Patient not taking: Reported on 10/26/2023), Disp: 30 tablet, Rfl: 0   naproxen  (NAPROSYN ) 500 MG  tablet, Take 1 tablet (500 mg total) by mouth 2 (two) times daily with a meal. (Patient not taking: Reported on 10/26/2023), Disp: 30 tablet, Rfl: 0   venlafaxine  XR (EFFEXOR  XR) 37.5 MG 24 hr capsule, Take 1 capsule (37.5 mg total) by mouth daily with breakfast. (Patient not taking: Reported on 10/26/2023), Disp: 90 capsule, Rfl: 3  No Known Allergies  Patient Care Team: Dana Hone, PA-C as PCP - General (Family Medicine)   Chart Review: I personally reviewed active problem list, medication list, allergies, family history, social history, health maintenance, notes from last encounter, lab results, imaging with the patient/caregiver today.   Review of Systems  Constitutional: Negative.   HENT: Negative.    Eyes: Negative.   Respiratory: Negative.    Cardiovascular: Negative.   Gastrointestinal: Negative.   Endocrine: Negative.   Genitourinary: Negative.   Musculoskeletal: Negative.   Skin: Negative.   Allergic/Immunologic: Negative.   Neurological: Negative.   Hematological: Negative.   Psychiatric/Behavioral: Negative.    All other systems reviewed and are negative.         Objective:   Vitals:  Vitals:   10/26/23 1441  BP: 120/74  Pulse: 82  Resp: 16  SpO2: 98%  Weight: 178 lb (80.7 kg)  Height: 5\' 4"  (1.626 m)    Body mass index is 30.55 kg/m.  Physical Exam Vitals and nursing note reviewed. Exam conducted with a chaperone present.  Constitutional:      General: She is not in acute distress.    Appearance: Normal appearance. She is well-developed. She is not ill-appearing, toxic-appearing or diaphoretic.  HENT:     Head: Normocephalic and atraumatic.     Right Ear: External ear normal.     Left Ear: External ear normal.     Nose: Nose normal.     Mouth/Throat:     Pharynx: Uvula midline.  Eyes:     General: Lids are normal.     Conjunctiva/sclera: Conjunctivae normal.     Pupils: Pupils are equal, round, and reactive to light.  Neck:     Trachea:  Phonation normal. No tracheal deviation.  Cardiovascular:     Rate and Rhythm: Normal rate and regular rhythm.     Pulses: Normal pulses.          Radial pulses are 2+ on the right side and 2+ on the left side.       Posterior tibial pulses are 2+ on the right side and 2+ on the left side.     Heart sounds: Normal heart sounds. No murmur heard.    No friction rub. No gallop.  Pulmonary:     Effort: Pulmonary effort is normal. No respiratory distress.     Breath sounds: Normal breath sounds. No stridor. No wheezing, rhonchi or rales.  Chest:     Chest wall: No tenderness.  Abdominal:  General: Bowel sounds are normal. There is no distension.     Palpations: Abdomen is soft.     Tenderness: There is no abdominal tenderness. There is no guarding or rebound.  Genitourinary:    General: Normal vulva.     Vagina: Vaginal discharge present.     Cervix: No cervical motion tenderness, friability or erythema.     Uterus: Normal.      Adnexa: Right adnexa normal and left adnexa normal.  Musculoskeletal:        General: No deformity. Normal range of motion.     Cervical back: Normal range of motion and neck supple.  Lymphadenopathy:     Cervical: No cervical adenopathy.  Skin:    General: Skin is warm and dry.     Capillary Refill: Capillary refill takes less than 2 seconds.     Coloration: Skin is not pale.     Findings: Lesion present. No rash.  Neurological:     Mental Status: She is alert and oriented to person, place, and time.     Motor: No abnormal muscle tone.     Gait: Gait normal.  Psychiatric:        Speech: Speech normal.        Behavior: Behavior normal.       Fall Risk:    10/26/2023    2:40 PM 01/20/2022    2:03 PM 12/16/2021    2:36 PM 09/14/2021    1:23 PM 03/17/2021    9:15 AM  Fall Risk   Falls in the past year? 0 0 0 0 0  Number falls in past yr: 0 0 0 0 0  Injury with Fall? 0 0 0 0 0  Risk for fall due to : No Fall Risks No Fall Risks No Fall Risks No  Fall Risks   Follow up Falls prevention discussed Falls prevention discussed;Education provided Falls prevention discussed;Education provided Falls prevention discussed     Functional Status Survey: Is the patient deaf or have difficulty hearing?: No Does the patient have difficulty seeing, even when wearing glasses/contacts?: No Does the patient have difficulty concentrating, remembering, or making decisions?: No Does the patient have difficulty walking or climbing stairs?: No Does the patient have difficulty dressing or bathing?: No Does the patient have difficulty doing errands alone such as visiting a doctor's office or shopping?: No   Assessment & Plan:    CPE completed today  USPSTF grade A and B recommendations reviewed with patient; age-appropriate recommendations, preventive care, screening tests, etc discussed and encouraged; healthy living encouraged; see AVS for patient education given to patient  Discussed importance of 150 minutes of physical activity weekly, AHA exercise recommendations given to pt in AVS/handout  Discussed importance of healthy diet:  eating lean meats and proteins, avoiding trans fats and saturated fats, avoid simple sugars and excessive carbs in diet, eat 6 servings of fruit/vegetables daily and drink plenty of water and avoid sweet beverages.    Recommended pt to do annual eye exam and routine dental exams/cleanings  Depression, alcohol, fall screening completed as documented above and per flowsheets  Advance Care planning information and packet discussed and offered today, encouraged pt to discuss with family members/spouse/partner/friends and complete Advanced directive packet and bring copy to office   Reviewed Health Maintenance: Health Maintenance  Topic Date Due   Colonoscopy  Never done   Cervical Cancer Screening (Pap smear)  11/13/2022   MAMMOGRAM  12/24/2022   COVID-19 Vaccine (4 - 2024-25 season) 11/11/2023 (  Originally 02/06/2023)    Zoster Vaccines- Shingrix (1 of 2) 01/26/2024 (Originally 12/24/2022)   DTaP/Tdap/Td (1 - Tdap) 10/25/2024 (Originally 12/24/1991)   Hepatitis C Screening  10/25/2024 (Originally 12/24/1990)   INFLUENZA VACCINE  01/06/2024   HIV Screening  Completed   HPV VACCINES  Aged Out   Meningococcal B Vaccine  Aged Out    Immunizations: Immunization History  Administered Date(s) Administered   Influenza,inj,Quad PF,6+ Mos 04/25/2020, 04/01/2021   PFIZER(Purple Top)SARS-COV-2 Vaccination 08/14/2019, 09/04/2019   Pfizer Covid-19 Vaccine Bivalent Booster 54yrs & up 04/01/2021       ICD-10-CM   1. Well adult exam  Z00.00 CBC with Differential/Platelet    Comprehensive metabolic panel with GFR    Hemoglobin A1c    Lipid panel    Hepatitis C antibody    2. Cervical cancer screening  Z12.4 Cytology - PAP    3. Breast cancer screening by mammogram  Z12.31 MM 3D SCREENING MAMMOGRAM BILATERAL BREAST    4. Colon cancer screening  Z12.11 Ambulatory referral to Gastroenterology    5. Class 1 obesity with body mass index (BMI) of 30.0 to 30.9 in adult, unspecified obesity type, unspecified whether serious comorbidity present  E66.811 CBC with Differential/Platelet   Z68.30 Comprehensive metabolic panel with GFR    Hemoglobin A1c    Lipid panel   gaining weight despite trying to eat healthier    6. Prediabetes  R73.03 Hemoglobin A1c    7. Encounter for hepatitis C screening test for low risk patient  Z11.59 Hepatitis C antibody    8. Screen for STD (sexually transmitted disease)  Z11.3 Cytology - PAP    Cervicovaginal ancillary only    RPR    HIV Antibody (routine testing w rflx)    9. Gastroesophageal reflux disease, unspecified whether esophagitis present  K21.9 pantoprazole (PROTONIX) 40 MG tablet   severe reflux and indigestion, trial of PPI    10. Vasomotor symptoms due to menopause  N95.1     11. Adjustment disorder with depressed mood  F43.21     12. 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

## 2023-10-26 NOTE — Patient Instructions (Addendum)
 Health Maintenance  Topic Date Due   Colon Cancer Screening  Never done   Pap Smear  11/13/2022   Mammogram  12/24/2022   COVID-19 Vaccine (4 - 2024-25 season) 11/11/2023*   Zoster (Shingles) Vaccine (1 of 2) 01/26/2024*   DTaP/Tdap/Td vaccine (1 - Tdap) 10/25/2024*   Hepatitis C Screening  10/25/2024*   Flu Shot  01/06/2024   HIV Screening  Completed   HPV Vaccine  Aged Out   Meningitis B Vaccine  Aged Out  *Topic was postponed. The date shown is not the original due date.    [facility] [address], [place], [place] [address] Scheduling phone #: [phone]   You can try prilosec over the counter or the prescription pantoprazole that I sent to the pharmacy  Follow up if not improved in 1 month

## 2023-10-27 ENCOUNTER — Ambulatory Visit: Payer: Self-pay | Admitting: Family Medicine

## 2023-10-27 LAB — CBC WITH DIFFERENTIAL/PLATELET
Absolute Lymphocytes: 3067 {cells}/uL (ref 850–3900)
Absolute Monocytes: 813 {cells}/uL (ref 200–950)
Basophils Absolute: 39 {cells}/uL (ref 0–200)
Basophils Relative: 0.4 %
Eosinophils Absolute: 108 {cells}/uL (ref 15–500)
Eosinophils Relative: 1.1 %
HCT: 37.8 % (ref 35.0–45.0)
Hemoglobin: 12.3 g/dL (ref 11.7–15.5)
MCH: 31 pg (ref 27.0–33.0)
MCHC: 32.5 g/dL (ref 32.0–36.0)
MCV: 95.2 fL (ref 80.0–100.0)
MPV: 11.3 fL (ref 7.5–12.5)
Monocytes Relative: 8.3 %
Neutro Abs: 5772 {cells}/uL (ref 1500–7800)
Neutrophils Relative %: 58.9 %
Platelets: 355 10*3/uL (ref 140–400)
RBC: 3.97 10*6/uL (ref 3.80–5.10)
RDW: 11.1 % (ref 11.0–15.0)
Total Lymphocyte: 31.3 %
WBC: 9.8 10*3/uL (ref 3.8–10.8)

## 2023-10-27 LAB — HEMOGLOBIN A1C
Hgb A1c MFr Bld: 5.7 % — ABNORMAL HIGH (ref <5.7)
Mean Plasma Glucose: 117 mg/dL
eAG (mmol/L): 6.5 mmol/L

## 2023-10-27 LAB — COMPREHENSIVE METABOLIC PANEL WITH GFR
AG Ratio: 1.8 (calc) (ref 1.0–2.5)
ALT: 14 U/L (ref 6–29)
AST: 11 U/L (ref 10–35)
Albumin: 4.4 g/dL (ref 3.6–5.1)
Alkaline phosphatase (APISO): 68 U/L (ref 37–153)
BUN: 10 mg/dL (ref 7–25)
CO2: 32 mmol/L (ref 20–32)
Calcium: 9.5 mg/dL (ref 8.6–10.4)
Chloride: 103 mmol/L (ref 98–110)
Creat: 0.87 mg/dL (ref 0.50–1.03)
Globulin: 2.5 g/dL (ref 1.9–3.7)
Glucose, Bld: 88 mg/dL (ref 65–99)
Potassium: 3.8 mmol/L (ref 3.5–5.3)
Sodium: 143 mmol/L (ref 135–146)
Total Bilirubin: 0.4 mg/dL (ref 0.2–1.2)
Total Protein: 6.9 g/dL (ref 6.1–8.1)
eGFR: 81 mL/min/{1.73_m2} (ref >=60)

## 2023-10-27 LAB — HIV ANTIBODY (ROUTINE TESTING W REFLEX): HIV 1&2 Ab, 4th Generation: NONREACTIVE

## 2023-10-27 LAB — LIPID PANEL
Cholesterol: 172 mg/dL (ref <200)
HDL: 41 mg/dL — ABNORMAL LOW (ref >=50)
LDL Cholesterol (Calc): 105 mg/dL — ABNORMAL HIGH
Non-HDL Cholesterol (Calc): 131 mg/dL — ABNORMAL HIGH (ref <130)
Total CHOL/HDL Ratio: 4.2 (calc) (ref <5.0)
Triglycerides: 149 mg/dL (ref <150)

## 2023-10-27 LAB — RPR: RPR Ser Ql: NONREACTIVE

## 2023-10-27 LAB — HEPATITIS C ANTIBODY: Hepatitis C Ab: NONREACTIVE

## 2023-10-28 ENCOUNTER — Encounter: Payer: Self-pay | Admitting: Family Medicine

## 2023-10-28 LAB — CERVICOVAGINAL ANCILLARY ONLY
Bacterial Vaginitis (gardnerella): POSITIVE — AB
Candida Glabrata: NEGATIVE
Candida Vaginitis: POSITIVE — AB
Chlamydia: NEGATIVE
Comment: NEGATIVE
Comment: NEGATIVE
Comment: NEGATIVE
Comment: NEGATIVE
Comment: NEGATIVE
Comment: NORMAL
Neisseria Gonorrhea: NEGATIVE
Trichomonas: NEGATIVE

## 2023-11-01 ENCOUNTER — Other Ambulatory Visit: Payer: Self-pay | Admitting: Family Medicine

## 2023-11-01 MED ORDER — FLUCONAZOLE 150 MG PO TABS: 150.0000 mg | ORAL_TABLET | ORAL | 0 refills | Status: DC

## 2023-11-01 MED ORDER — METRONIDAZOLE 250 MG PO TABS: 250.0000 mg | ORAL_TABLET | Freq: Two times a day (BID) | ORAL | 0 refills | Status: AC

## 2023-11-02 LAB — CYTOLOGY - PAP
Adequacy: ABSENT
Comment: NEGATIVE
High risk HPV: NEGATIVE

## 2023-11-03 ENCOUNTER — Other Ambulatory Visit: Payer: Self-pay

## 2023-11-03 DIAGNOSIS — R87612 Low grade squamous intraepithelial lesion on cytologic smear of cervix (LGSIL): Secondary | ICD-10-CM

## 2023-11-15 ENCOUNTER — Ambulatory Visit: Payer: Self-pay

## 2023-11-15 NOTE — Telephone Encounter (Signed)
 Appointment needed

## 2023-11-15 NOTE — Telephone Encounter (Signed)
   FYI Only or Action Required?: FYI only for provider  Patient was last seen in primary care on 09/14/2021 by Kandis Ormond, DO. Called Nurse Triage reporting Cough. Symptoms began a week ago. Interventions attempted: Rest, hydration, or home remedies. Symptoms are: gradually worsening.  Triage Disposition: See Physician Within 24 Hours  Patient/caregiver understands and will follow disposition?: Yes                 Copied from CRM [id number]. Topic: Clinical - Red Word Triage >> Nov 15, 2023  3:38 PM Elle L wrote: Red Word that prompted transfer to Nurse Triage: The patient states she had a virtual visit and was prescribed cough syrup but now her cough is worsening and it is a productive cough. Reason for Disposition  [place]  Answer Assessment - Initial Assessment Questions 1. ONSET: "When did the cough begin?"      Pt was will about 3 weeks ago, worsening since x 1 wk ago  3. SPUTUM: "Describe the color of your sputum" (none, dry cough; clear, white, yellow, green)     Intermittent yellow sputum, "feels stuck in my throat" 4. HEMOPTYSIS: "Are you coughing up any blood?" If so ask: "How much?" (flecks, streaks, tablespoons, etc.)     None 5. DIFFICULTY BREATHING: "Are you having difficulty breathing?" If Yes, ask: "How bad is it?" (e.g., mild, moderate, severe)    - MILD: No SOB at rest, mild SOB with walking, speaks normally in sentences, can lie down, no retractions, pulse < 100.    - MODERATE: SOB at rest, SOB with minimal exertion and prefers to sit, cannot lie down flat, speaks in phrases, mild retractions, audible wheezing, pulse 100-120.    - SEVERE: Very SOB at rest, speaks in single words, struggling to breathe, sitting hunched forward, retractions, pulse > 120      None 6. FEVER: "Do you have a fever?" If Yes, ask: "What is your temperature, how was it measured, and when did it start?"     None  10. OTHER SYMPTOMS: "Do you have any other symptoms?" (e.g.,  runny nose, wheezing, chest pain)       Nasal congestion  Protocols used: Cough - Acute Productive-A-AH

## 2023-11-16 ENCOUNTER — Encounter: Payer: Self-pay | Admitting: Nurse Practitioner

## 2023-11-16 ENCOUNTER — Ambulatory Visit (INDEPENDENT_AMBULATORY_CARE_PROVIDER_SITE_OTHER): Admitting: Nurse Practitioner

## 2023-11-16 VITALS — BP 130/84 | HR 83 | Temp 98.0°F | Resp 16 | Ht 64.0 in | Wt 179.0 lb

## 2023-11-16 DIAGNOSIS — R052 Subacute cough: Secondary | ICD-10-CM | POA: Diagnosis not present

## 2023-11-16 DIAGNOSIS — J0140 Acute pansinusitis, unspecified: Secondary | ICD-10-CM | POA: Diagnosis not present

## 2023-11-16 MED ORDER — BENZONATATE 100 MG PO CAPS: 200.0000 mg | ORAL_CAPSULE | Freq: Two times a day (BID) | ORAL | 0 refills | Status: DC | PRN

## 2023-11-16 MED ORDER — PROMETHAZINE-DM 6.25-15 MG/5ML PO SYRP: 5.0000 mL | ORAL_SOLUTION | Freq: Four times a day (QID) | ORAL | 0 refills | Status: DC | PRN

## 2023-11-16 MED ORDER — ALBUTEROL SULFATE HFA 108 (90 BASE) MCG/ACT IN AERS
2.0000 | INHALATION_SPRAY | Freq: Four times a day (QID) | RESPIRATORY_TRACT | 0 refills | Status: AC | PRN
Start: 2023-11-16 — End: ?

## 2023-11-16 MED ORDER — AMOXICILLIN-POT CLAVULANATE 875-125 MG PO TABS: 1.0000 | ORAL_TABLET | Freq: Two times a day (BID) | ORAL | 0 refills | Status: DC

## 2023-11-16 NOTE — Patient Instructions (Signed)
 Recommend taking zyrtec, flonase , and mucinex.  Push fluids and get rest.    Avoid dairy

## 2023-11-16 NOTE — Progress Notes (Addendum)
 BP 130/84   Pulse 83   Temp 98 F (36.7 C)   Resp 16   Ht 5' 4 (1.626 m)   Wt 179 lb (81.2 kg)   SpO2 98%   BMI 30.73 kg/m    Subjective:    Patient ID: Dana Wolf, female    DOB: June 24, 1970, 53 y.o.   MRN: [id number]  HPI: Dana Wolf is a 53 y.o. female presenting today with a cough that started 2 weeks ago, but has exacerbated over the past week and worse at night. Patient also endorses nasal congestion, ear fullness and itchiness. Reports occasional wheezing. Endorses an occasional headache. Denies fever and SOB.  Home interventions include, rest, hydration, and home remedies for cough, but reports symptoms are gradually worsening.   Cough characteristics? Dry? Yes  Wet? No  Deep? Yes  Sputum production? None.    Allergy medicine? None reported.        10/26/2023    2:41 PM 01/20/2022    2:03 PM 12/16/2021    2:36 PM  Depression screen PHQ 2/9  Decreased Interest 1 2 3   Down, Depressed, Hopeless 1 2 3   PHQ - 2 Score 2 4 6   Altered sleeping 0 2 3  Tired, decreased energy 3 2 3   Change in appetite 1 1 3   Feeling bad or failure about yourself  0 0 3  Trouble concentrating 0 0 3  Moving slowly or fidgety/restless 0 0 0  Suicidal thoughts 0 0 0  PHQ-9 Score 6 9 21   Difficult doing work/chores  Somewhat difficult Very difficult    Relevant past medical, surgical, family and social history reviewed and updated as indicated. Interim medical history since our last visit reviewed. Allergies and medications reviewed and updated.  Review of Systems  Constitutional: Negative for fever or weight change.  Respiratory:Reports cough and occasional wheezing. Denies SOB.   Cardiovascular: Negative for chest pain or palpitations.  Gastrointestinal: Negative for abdominal pain, no bowel changes.  Musculoskeletal: Negative for gait problem or joint swelling.  Skin: Negative for rash.  Neurological: Negative for dizziness or headache.  No other specific complaints in a  complete review of systems (except as listed in HPI above).      Objective:      BP 130/84   Pulse 83   Temp 98 F (36.7 C)   Resp 16   Ht 5' 4 (1.626 m)   Wt 179 lb (81.2 kg)   SpO2 98%   BMI 30.73 kg/m    Wt Readings from Last 3 Encounters:  11/16/23 179 lb (81.2 kg)  10/26/23 178 lb (80.7 kg)  01/20/22 150 lb 14.4 oz (68.4 kg)    Physical Exam Constitutional:      Appearance: Normal appearance.  HENT:     Nose: Congestion present.     Right Turbinates: Enlarged.     Left Turbinates: Enlarged.     Right Sinus: Maxillary sinus tenderness and frontal sinus tenderness present.     Left Sinus: Maxillary sinus tenderness and frontal sinus tenderness present.     Mouth/Throat:     Tonsils: No tonsillar exudate.  Cardiovascular:     Rate and Rhythm: Normal rate and regular rhythm.  Pulmonary:     Effort: Pulmonary effort is normal.     Breath sounds: Normal breath sounds.  Lymphadenopathy:     Cervical: No cervical adenopathy.  Neurological:     Mental Status: She is alert.  Assessment & Plan:   Problem List Items Addressed This Visit   None Visit Diagnoses       Subacute cough    -  Primary   Tessalon  pearls, Promethazine  DM cough syrup, and Augmentin prescribed. Also begin Zyrtec daily. Increase fluids. Avoid any airway triggers.   Relevant Medications   benzonatate  (TESSALON ) 100 MG capsule   promethazine -dextromethorphan (PROMETHAZINE -DM) 6.25-15 MG/5ML syrup   albuterol (VENTOLIN HFA) 108 (90 Base) MCG/ACT inhaler     Acute non-recurrent pansinusitis       Begin taking Augmentin twice daily for 10 days.   Relevant Medications   amoxicillin-clavulanate (AUGMENTIN) 875-125 MG tablet   benzonatate  (TESSALON ) 100 MG capsule   promethazine -dextromethorphan (PROMETHAZINE -DM) 6.25-15 MG/5ML syrup        Recommend taking zyrtec, flonase , mucinex. Push fluids and get rest.   Avoid dairy Reach out if no improvement.         Follow up  plan: Return if symptoms worsen or fail to improve.   I have reviewed this encounter including the documentation in this note and/or discussed this patient with the provider, Aislinn Womack, SNP, I am certifying that I agree with the content of this note as supervising/preceptor nurse practitioner.  Donny Gall, FNP-C [facility] 11/16/2023, 11:24 AM

## 2023-11-17 ENCOUNTER — Encounter: Payer: Self-pay | Admitting: Nurse Practitioner

## 2023-12-12 NOTE — Progress Notes (Deleted)
 Referring Provider:  Olam Cower PA-C  HPI:  Dana Wolf is a 53 y.o.  [phone]  who presents today for evaluation and management of abnormal cervical cytology.    Prior pap smears:  Date:10/26/23   Eje:ODPO     YEC:Wzhjupcz  Date:12/02/21   Eje:Jdrld   YEC:Wzhjupcz Date:03/17/21   Eje:Jdrld   YEC:Wzhjupcz Date:04/25/20   Eje:Odpo       YEC:Endpupcz,Wzhjupcz 16/18/45 Date:04/23/19   Eje:Odpo       YEC:Endpupcz,Wzhjupcz 16/18/45 Date:08/02/16   Eje:Jdrld  YEC:Endpupcz,Nwz or more High/Intermediate HPV types  (16,18,31,33,35,39,45,51,52,56,58,59,66,68) was detected.   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  Date:05/23/19  Results: 4:00bx Low Grade CIN 1  Prior cervical treatment(s): Leep Date:11/13/20 Results: Cervix Posterior CIN II, Cervix Anterior CINI  Symptoms/History:  -Abnormal vaginal discharge: *** -Postmenopausal: *** -Intermenstrual bleeding: *** -Postcoital bleeding: *** -Bleeding problems (non-gyn): *** -Contraception: *** -Number of current sexual partners: *** -Number of partners in lifetime: *** -History of a high risk partner: *** -History of STDs: *** -Smoking: *** -Gardasil Vaccine: ***      ROS:  {Ros - complete:30496}  OB History  Gravida Para Term Preterm AB Living  3 3 2 1  3   SAB IAB Ectopic Multiple Live Births      3    # Outcome Date GA Lbr Len/2nd Weight Sex Type Anes PTL Lv  3 Term 02/28/98   6 lb 1 oz (2.75 kg) F Vag-Spont None  LIV  2 Preterm 10/29/95 [redacted]w[redacted]d  5 lb 5 oz (2.41 kg) M Vag-Spont None  LIV     Complications: Ruptured, membranes, premature  1 Term 02/16/92   7 lb 1 oz (3.204 kg) F Vag-Spont None  LIV    Past Medical History:  Diagnosis Date   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  She has a current medication list which includes the following prescription(s): albuterol , amoxicillin -clavulanate, benzonatate , hydroxyzine , meclizine , naproxen , pantoprazole , promethazine -dextromethorphan, and venlafaxine  xr.  Physical Exam: -Vitals:  There were no vitals taken for this visit.  PROCEDURE: Colposcopy performed with 4% acetic acid and Lugol's after informed consent obtained.  Physical Exam                            -Aceto-white Lesions Location(s): See above              -Biopsy performed at *** o'clock               -ECC indicated and performed: {yes no:314532}     -Biopsy sites made hemostatic with pressure and Monsel's solution   -Satisfactory colposcopy: {yes no:314532}    -Evidence of Invasive cervical  CA :  NO  ASSESSMENT:  Dana Wolf is a 53 y.o. [phone] with LSIL***ASCUS and HPV-HR positive*** 16/18/45 POS***NEG on recent pap (DATE), here for colposcopy today, performed as above without complications.  -ECC and 3 cervical bx sent to pathology -Aftercare instructions for home reviewed, si/sx of when to call/return discussed. -RTC 2 weeks for results, sooner prn  No orders of the defined types were placed in this encounter.          F/U  No follow-ups on file.   Estil Mangle, DO [facility]

## 2023-12-14 ENCOUNTER — Encounter: Admitting: Obstetrics

## 2023-12-15 ENCOUNTER — Encounter: Payer: Self-pay | Admitting: Obstetrics

## 2024-01-24 ENCOUNTER — Encounter: Admitting: Obstetrics & Gynecology

## 2024-02-28 ENCOUNTER — Ambulatory Visit (INDEPENDENT_AMBULATORY_CARE_PROVIDER_SITE_OTHER)

## 2024-02-28 ENCOUNTER — Ambulatory Visit (INDEPENDENT_AMBULATORY_CARE_PROVIDER_SITE_OTHER): Admitting: Podiatry

## 2024-02-28 ENCOUNTER — Encounter: Payer: Self-pay | Admitting: Podiatry

## 2024-02-28 DIAGNOSIS — M65971 Unspecified synovitis and tenosynovitis, right ankle and foot: Secondary | ICD-10-CM | POA: Diagnosis not present

## 2024-02-28 DIAGNOSIS — M7751 Other enthesopathy of right foot: Secondary | ICD-10-CM | POA: Diagnosis not present

## 2024-02-28 DIAGNOSIS — M7752 Other enthesopathy of left foot: Secondary | ICD-10-CM

## 2024-02-28 DIAGNOSIS — M722 Plantar fascial fibromatosis: Secondary | ICD-10-CM

## 2024-02-28 DIAGNOSIS — M65972 Unspecified synovitis and tenosynovitis, left ankle and foot: Secondary | ICD-10-CM

## 2024-02-28 MED ORDER — BETAMETHASONE SOD PHOS & ACET 6 (3-3) MG/ML IJ SUSP
3.0000 mg | Freq: Once | INTRAMUSCULAR | Status: AC
  Administered 2024-02-28: 3 mg via INTRA_ARTICULAR

## 2024-02-28 MED ORDER — MELOXICAM 15 MG PO TABS
15.0000 mg | ORAL_TABLET | Freq: Every day | ORAL | 1 refills | Status: AC
Start: 2024-02-28 — End: 2024-06-27

## 2024-02-28 NOTE — Progress Notes (Signed)
   Chief Complaint  Patient presents with   Foot Pain    Pt is here due to bilateral foot and ankle pain, states the pain began a while ago, the pain is off and on, starts at the top of the foot and radiates to the ankle, no injury to either.    HPI: 53 y.o. female presenting today as a reestablish new patient for evaluation of pain and tenderness to the bilateral feet.  She works on her feet all day long concrete floors.  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  Dermatology: Skin is warm, dry and supple bilateral lower extremities.   Vascular: Palpable pedal pulses bilaterally. Capillary refill within normal limits.  No appreciable edema.  No erythema.  Neurological: Grossly intact via light touch  Musculoskeletal Exam: Diffuse tenderness throughout palpation to the foot and ankle.  Tenderness is most appreciably noted to the ankle joint bilateral.  Range of motion WNL.  Muscle strength 5/5 all compartments  Radiographic Exam B/L foot and ankle 02/28/2024:  Normal osseous mineralization. Joint spaces preserved.  No fractures or osseous irregularities noted.  Impression: Negative  Assessment/Plan of Care: 1.  Bilateral ankle synovitis 2.  Generalized foot pain bilateral  -Patient evaluated.  X-rays reviewed -Injection of 0.5 cc Celestone  Soluspan injected into the anterolateral aspect of the right ankle and the anteromedial aspect of the left ankle -Prescription for meloxicam  15 mg daily -Today the patient was molded for custom orthotics.  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    Thresa EMERSON Sar, DPM [facility]  Dr. Thresa EMERSON Sar, DPM    [address] [place], [place] [address]                Office [phone]  Fax [phone]

## 2024-03-14 ENCOUNTER — Encounter: Payer: Self-pay | Admitting: Family Medicine

## 2024-04-26 ENCOUNTER — Ambulatory Visit
Admission: RE | Admit: 2024-04-26 | Discharge: 2024-04-26 | Disposition: A | Discharge status: Home / Self Care | Source: Ambulatory Visit | Attending: Family Medicine | Admitting: Family Medicine

## 2024-04-26 DIAGNOSIS — Z1231 Encounter for screening mammogram for malignant neoplasm of breast: Secondary | ICD-10-CM | POA: Diagnosis not present

## 2024-05-07 ENCOUNTER — Telehealth: Payer: Self-pay

## 2024-05-07 NOTE — Telephone Encounter (Signed)
 Orthotics are in 05/07/2024

## 2024-05-10 ENCOUNTER — Telehealth: Admitting: Physician Assistant

## 2024-05-10 ENCOUNTER — Encounter: Payer: Self-pay | Admitting: Physician Assistant

## 2024-05-10 DIAGNOSIS — J069 Acute upper respiratory infection, unspecified: Secondary | ICD-10-CM | POA: Diagnosis not present

## 2024-05-10 MED ORDER — PROMETHAZINE-DM 6.25-15 MG/5ML PO SYRP: 5.0000 mL | ORAL_SOLUTION | Freq: Four times a day (QID) | ORAL | 0 refills | Status: AC | PRN

## 2024-05-10 NOTE — Progress Notes (Signed)

## 2024-06-29 ENCOUNTER — Other Ambulatory Visit
